# Patient Record
Sex: Male | Born: 1945 | Race: Black or African American | Hispanic: No | Marital: Single | Smoking: Former smoker
Health system: Southern US, Community
[De-identification: ages and names within clinical notes are randomized; demographics above are authoritative.]

## PROBLEM LIST (undated history)

## (undated) DIAGNOSIS — J439 Emphysema, unspecified: Secondary | ICD-10-CM

## (undated) DIAGNOSIS — K219 Gastro-esophageal reflux disease without esophagitis: Secondary | ICD-10-CM

## (undated) DIAGNOSIS — E785 Hyperlipidemia, unspecified: Secondary | ICD-10-CM

## (undated) DIAGNOSIS — J45909 Unspecified asthma, uncomplicated: Secondary | ICD-10-CM

## (undated) HISTORY — DX: Emphysema, unspecified: J43.9

## (undated) HISTORY — DX: Gastro-esophageal reflux disease without esophagitis: K21.9

## (undated) HISTORY — PX: COLONOSCOPY: SHX174

## (undated) HISTORY — DX: Hyperlipidemia, unspecified: E78.5

---

## 2004-06-15 ENCOUNTER — Ambulatory Visit: Payer: Self-pay | Admitting: Psychology

## 2004-07-07 ENCOUNTER — Ambulatory Visit: Payer: Self-pay | Admitting: Psychiatry

## 2004-07-25 ENCOUNTER — Ambulatory Visit: Payer: Self-pay | Admitting: Psychology

## 2004-08-30 ENCOUNTER — Ambulatory Visit: Payer: Self-pay | Admitting: Psychology

## 2011-07-26 ENCOUNTER — Ambulatory Visit (HOSPITAL_COMMUNITY): Payer: Self-pay | Admitting: Psychology

## 2013-05-27 ENCOUNTER — Telehealth: Payer: Self-pay | Admitting: *Deleted

## 2017-08-14 ENCOUNTER — Ambulatory Visit (INDEPENDENT_AMBULATORY_CARE_PROVIDER_SITE_OTHER): Payer: Worker's Compensation | Admitting: Pulmonary Disease

## 2017-08-14 ENCOUNTER — Encounter: Payer: Self-pay | Admitting: Pulmonary Disease

## 2017-08-14 ENCOUNTER — Other Ambulatory Visit (INDEPENDENT_AMBULATORY_CARE_PROVIDER_SITE_OTHER): Payer: Medicare PPO

## 2017-08-14 DIAGNOSIS — J920 Pleural plaque with presence of asbestos: Secondary | ICD-10-CM | POA: Insufficient documentation

## 2017-08-14 DIAGNOSIS — J449 Chronic obstructive pulmonary disease, unspecified: Secondary | ICD-10-CM | POA: Insufficient documentation

## 2017-08-14 DIAGNOSIS — R911 Solitary pulmonary nodule: Secondary | ICD-10-CM

## 2017-08-14 LAB — CBC WITH DIFFERENTIAL/PLATELET
BASOS ABS: 0.1 10*3/uL (ref 0.0–0.1)
Basophils Relative: 1.2 % (ref 0.0–3.0)
EOS ABS: 0.4 10*3/uL (ref 0.0–0.7)
Eosinophils Relative: 4.4 % (ref 0.0–5.0)
HCT: 42 % (ref 39.0–52.0)
Hemoglobin: 13.8 g/dL (ref 13.0–17.0)
LYMPHS ABS: 2.4 10*3/uL (ref 0.7–4.0)
Lymphocytes Relative: 25.7 % (ref 12.0–46.0)
MCHC: 32.8 g/dL (ref 30.0–36.0)
MCV: 83.9 fl (ref 78.0–100.0)
MONO ABS: 0.6 10*3/uL (ref 0.1–1.0)
Monocytes Relative: 6.9 % (ref 3.0–12.0)
NEUTROS ABS: 5.8 10*3/uL (ref 1.4–7.7)
NEUTROS PCT: 61.8 % (ref 43.0–77.0)
PLATELETS: 331 10*3/uL (ref 150.0–400.0)
RBC: 5.01 Mil/uL (ref 4.22–5.81)
RDW: 15.3 % (ref 11.5–15.5)
WBC: 9.3 10*3/uL (ref 4.0–10.5)

## 2017-08-14 LAB — COMPREHENSIVE METABOLIC PANEL
ALK PHOS: 92 U/L (ref 39–117)
ALT: 14 U/L (ref 0–53)
AST: 17 U/L (ref 0–37)
Albumin: 3.7 g/dL (ref 3.5–5.2)
BILIRUBIN TOTAL: 0.6 mg/dL (ref 0.2–1.2)
BUN: 10 mg/dL (ref 6–23)
CO2: 29 meq/L (ref 19–32)
CREATININE: 1.13 mg/dL (ref 0.40–1.50)
Calcium: 8.7 mg/dL (ref 8.4–10.5)
Chloride: 106 mEq/L (ref 96–112)
GFR: 82.71 mL/min (ref >60.00)
GLUCOSE: 91 mg/dL (ref 70–99)
Potassium: 4 mEq/L (ref 3.5–5.1)
Sodium: 141 mEq/L (ref 135–145)
TOTAL PROTEIN: 7 g/dL (ref 6.0–8.3)

## 2017-08-14 NOTE — Assessment & Plan Note (Addendum)
  Hypermetabolic left upper lobe nodule, noted in October 04/2017 new compared to 2012 CT  Blood work today. CT chest with IV contrast will be scheduled-since 3 months have passed since this last PET scan  Based on results we will decide about doing a biopsy.  If lymphadenopathy is significant and we may proceed with EBUS and obtain washings of left upper lobe

## 2017-08-14 NOTE — Progress Notes (Signed)
Subjective:    Patient ID: Wesley Daugherty, male    DOB: 09-14-1945, 72 y.o.   MRN: [id number]  HPI  72 year old ex-smoker presents for evaluation of abnormal imaging studies. 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 Esau Grew in [place] for asbestos exposure.  He also follows up with primary care at [facility] clinic with Dr. Leonie Douglas & I Note that he is maintained on Symbicort and Spiriva.  On further questioning, he does mention that the [place] did tell him that he had COPD but these medications have not really helped him and he stopped taking these.  He is referred by Dr. Caron Presume for oncology from [place].  I have reviewed his consultation and his imaging reports. Apparently CT chest without contrast was performed on 04/26/17 which showed new bilateral lung nodules, left upper lobe was noncalcified and lobulated measuring 1.5 x 1.4 cm and right upper lobe was about 5 mm.  These were both Compared to CT chest from 2012 no hilar or mediastinal lymphadenopathy was noted. Subsequent PET scan from 05/18/2017 showed hypermetabolic left upper lobe nodule SUV 2.6, right hilar node was also hypermetabolic with SUV of 5.2 and left hilar was 3.0 with subcarinal lymph node SUV of 3.0 there was some hypermetabolism noted in the distal esophagus.  He reports mild dyspnea on exertion.  He denies frequent chest colds.  He reports intermittent wheezing.  He uses albuterol but is not compliant with Symbicort or Spiriva. He has occasional cough with clear sputum production He denies any problems swallowing There is no history of redness of his eyes or skin lesions  Past medical history of hypertension, asbestos exposure and COPD as well and severe depression  Past surgical history none.  Family history not significant for lung cancer, no history of early cardiac  disease.  Social history he smoked 1/2 pack/day starting in his 12s after he left the service until he quit in 2005, about 15 pack years.  He worked at [employer] as a Psychologist, occupational until he returned and was in 6. He is divorced and does not have any children    Review of Systems  Positive for acid heartburn, shortness of breath with activity, sore throat, nasal congestion  Constitutional: negative for anorexia, fevers and sweats  Eyes: negative for irritation, redness and visual disturbance  Ears, nose, mouth, throat, and face: negative for earaches, epistaxis, nasal congestion and sore throat  Respiratory: negative for cough, dyspnea on exertion, sputum and wheezing  Cardiovascular: negative for chest pain, dyspnea, lower extremity edema, orthopnea, palpitations and syncope  Gastrointestinal: negative for abdominal pain, constipation, diarrhea, melena, nausea and vomiting  Genitourinary:negative for dysuria, frequency and hematuria  Hematologic/lymphatic: negative for bleeding, easy bruising and lymphadenopathy  Musculoskeletal:negative for arthralgias, muscle weakness and stiff joints  Neurological: negative for coordination problems, gait problems, headaches and weakness  Endocrine: negative for diabetic symptoms including polydipsia, polyuria and weight loss     Objective:   Physical Exam  Gen. Pleasant, obese, in no distress, normal affect ENT - no lesions, no post nasal drip, class 2-3 airway Neck: No JVD, no thyromegaly, no carotid bruits Lungs: no use of accessory muscles, no dullness to percussion, decreased without rales or rhonchi  Cardiovascular: Rhythm regular, heart sounds  normal, no murmurs or gallops, no peripheral edema Abdomen: soft and non-tender, no hepatosplenomegaly, BS normal. Musculoskeletal:  No deformities, no cyanosis or clubbing Neuro:  alert, non focal, no tremors        Assessment & Plan:

## 2017-08-14 NOTE — Assessment & Plan Note (Signed)
Obtain pulmonary function tests from Dr. Valorie RooseveltProctor in [place]

## 2017-08-14 NOTE — Assessment & Plan Note (Signed)
Obtain pulmonary function tests from Dr. Valorie RooseveltProctor in [place] Continue Symbicort and Spiriva which [place] [facility]

## 2017-08-14 NOTE — Patient Instructions (Signed)
Blood work today. CT chest with IV contrast will be scheduled.  Based on results we will decide about doing a biopsy.  Obtain pulmonary function tests from Dr. Valorie RooseveltProctor in [place]

## 2017-08-15 NOTE — Progress Notes (Signed)
LMOMTCB x 1 

## 2017-08-27 ENCOUNTER — Ambulatory Visit (HOSPITAL_COMMUNITY): Payer: Worker's Compensation

## 2017-08-28 ENCOUNTER — Ambulatory Visit: Payer: Self-pay | Admitting: Adult Health

## 2017-09-07 ENCOUNTER — Ambulatory Visit (HOSPITAL_COMMUNITY)
Admission: RE | Admit: 2017-09-07 | Discharge: 2017-09-07 | Disposition: A | Discharge status: Home / Self Care | Payer: Worker's Compensation | Source: Ambulatory Visit | Attending: Pulmonary Disease | Admitting: Pulmonary Disease

## 2017-09-07 DIAGNOSIS — R911 Solitary pulmonary nodule: Secondary | ICD-10-CM

## 2017-09-07 DIAGNOSIS — I2510 Atherosclerotic heart disease of native coronary artery without angina pectoris: Secondary | ICD-10-CM | POA: Insufficient documentation

## 2017-09-07 DIAGNOSIS — R918 Other nonspecific abnormal finding of lung field: Secondary | ICD-10-CM | POA: Insufficient documentation

## 2017-09-07 DIAGNOSIS — I700 Atherosclerosis of aorta: Secondary | ICD-10-CM | POA: Insufficient documentation

## 2017-09-07 DIAGNOSIS — J984 Other disorders of lung: Secondary | ICD-10-CM | POA: Insufficient documentation

## 2017-09-07 MED ORDER — IOPAMIDOL (ISOVUE-300) INJECTION 61%
75.0000 mL | Freq: Once | INTRAVENOUS | Status: AC | PRN
  Administered 2017-09-07: 75 mL via INTRAVENOUS

## 2018-10-29 DIAGNOSIS — R911 Solitary pulmonary nodule: Secondary | ICD-10-CM | POA: Diagnosis not present

## 2018-10-29 DIAGNOSIS — J449 Chronic obstructive pulmonary disease, unspecified: Secondary | ICD-10-CM | POA: Diagnosis not present

## 2018-10-29 DIAGNOSIS — D649 Anemia, unspecified: Secondary | ICD-10-CM | POA: Diagnosis not present

## 2019-04-29 IMAGING — CT CT CHEST W/ CM
2 of 4 series · 15 of 36 positions shown, 18 images · IV contrast (iopamidol)
Comparison: None.

CLINICAL DATA: Solitary pulmonary nodule. No prior exams for
comparison.

EXAM:
CT CHEST WITH CONTRAST
TECHNIQUE: Multidetector CT imaging of the chest was performed during
intravenous contrast administration.
CONTRAST:  75mL O81NSQ-UEE IOPAMIDOL (O81NSQ-UEE) INJECTION 61%

[Series 2: axial st · axial · 0.74mm/px · z∈[+1424,+1690]mm · 12 of 157 slices shown, 15 images]
[im 12/157  mediastinal]
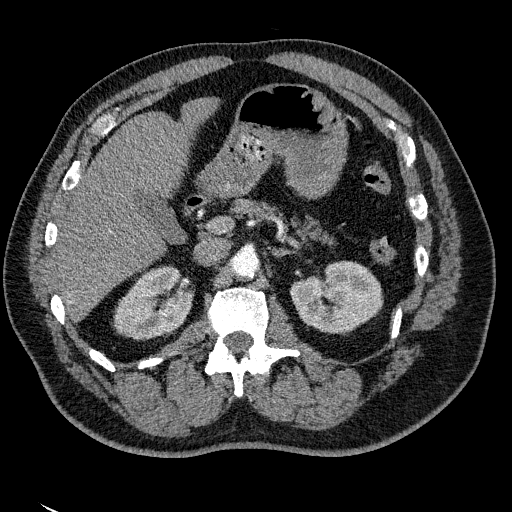
[im 12/157  lung]
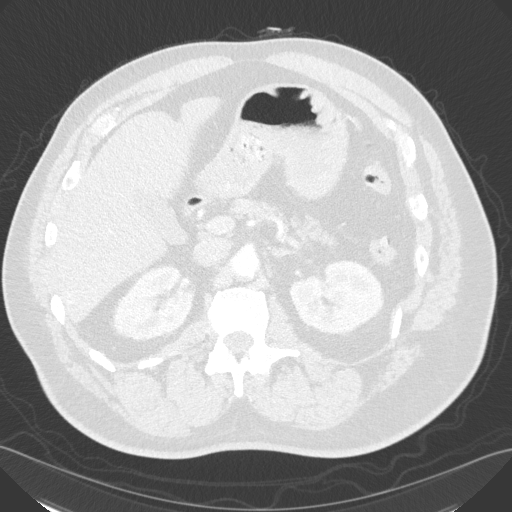
[im 23/157  lung]
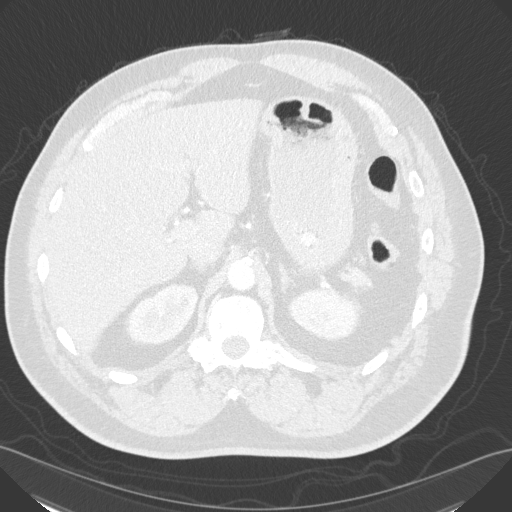
[im 34/157  lung]
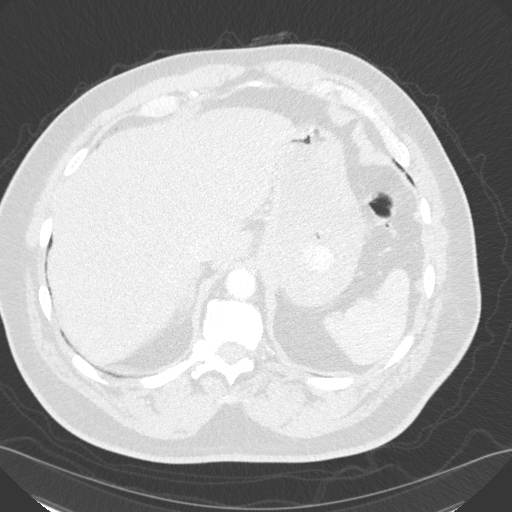
[im 45/157  lung]
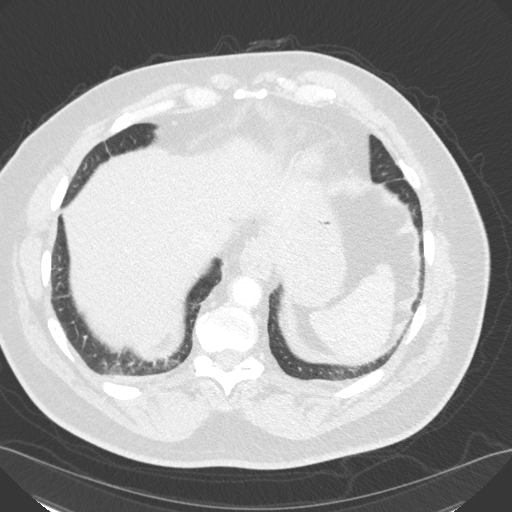
[im 56/157  mediastinal]
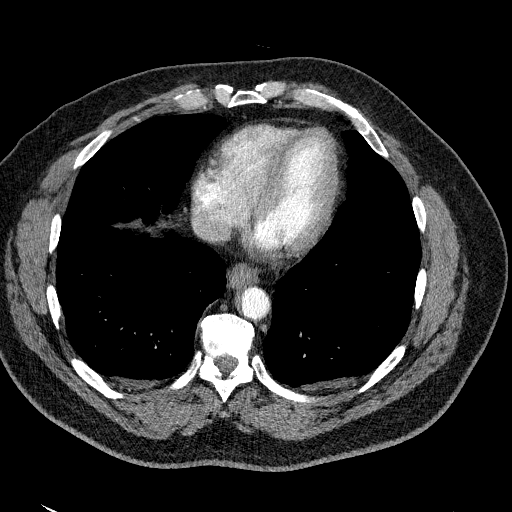
[im 56/157  lung]
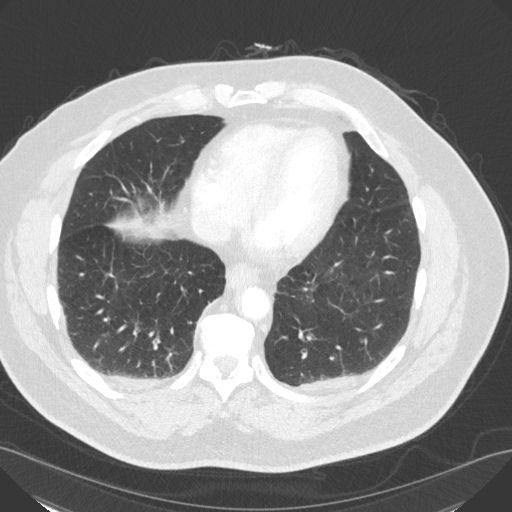
[im 67/157  lung]
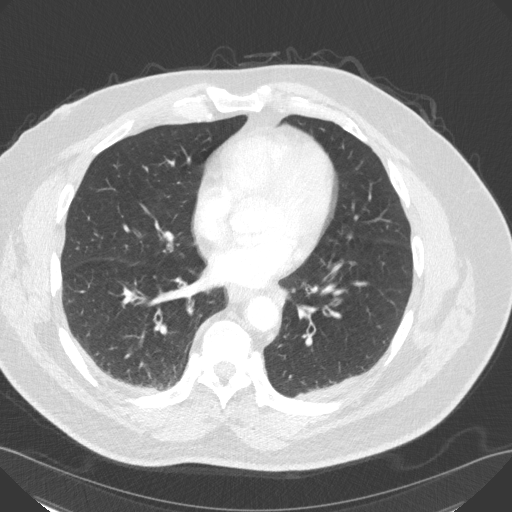
[im 90/157  lung]
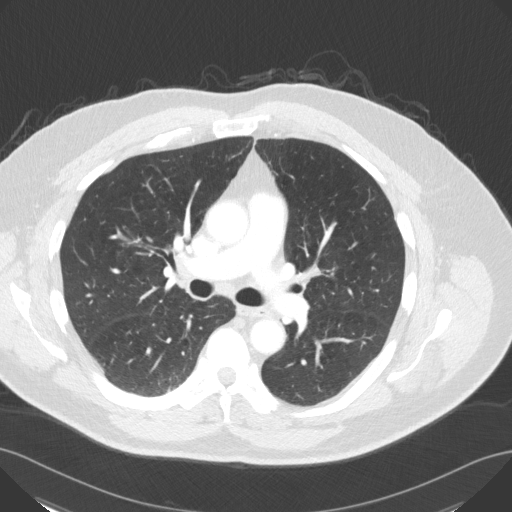
[im 101/157  lung]
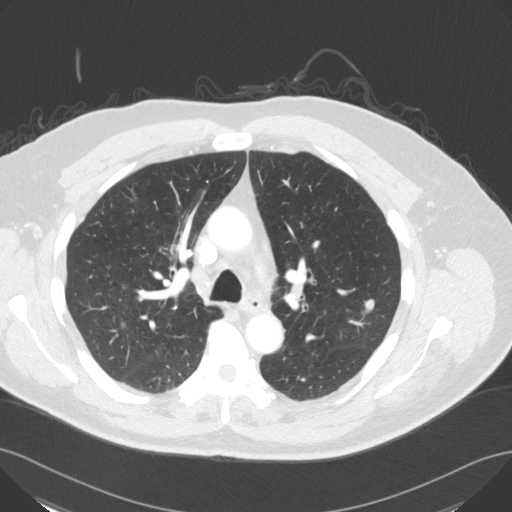
[im 112/157  mediastinal]
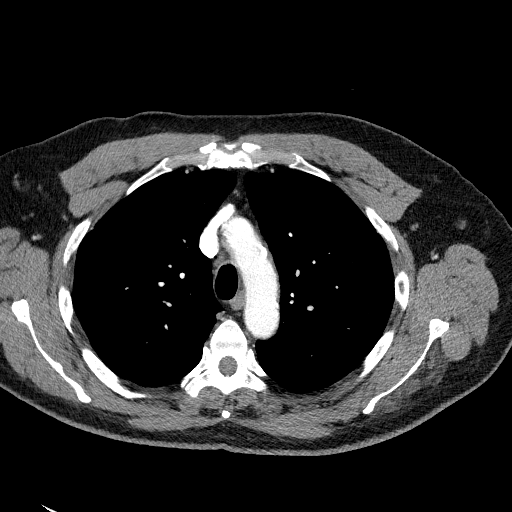
[im 112/157  lung]
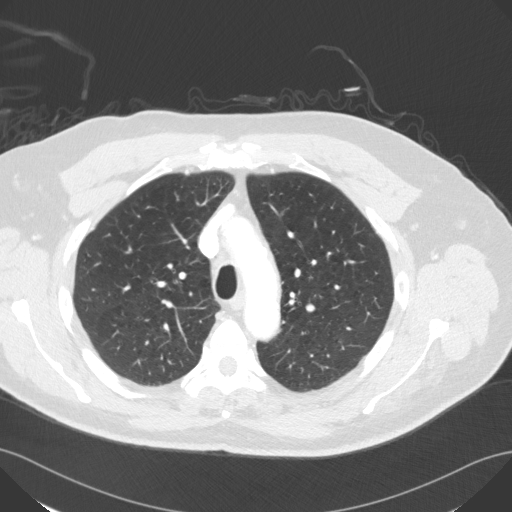
[im 123/157  lung]
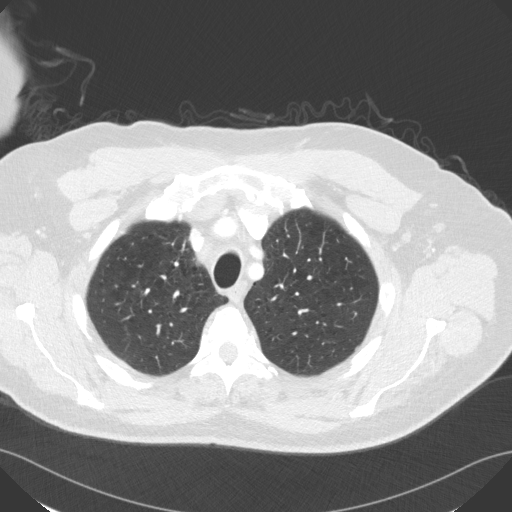
[im 134/157  lung]
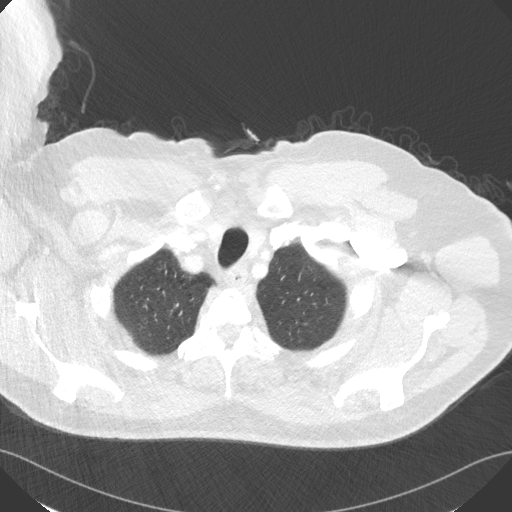
[im 145/157  lung]
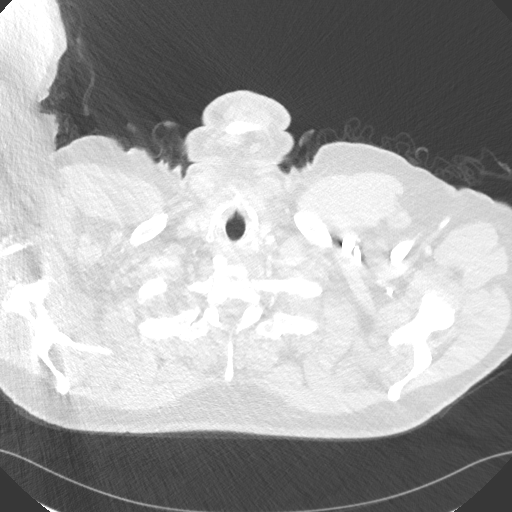

[Series 5: coronal · coronal · 0.60mm/px · 3 of 154 slices shown]
[im 31/154  lung]
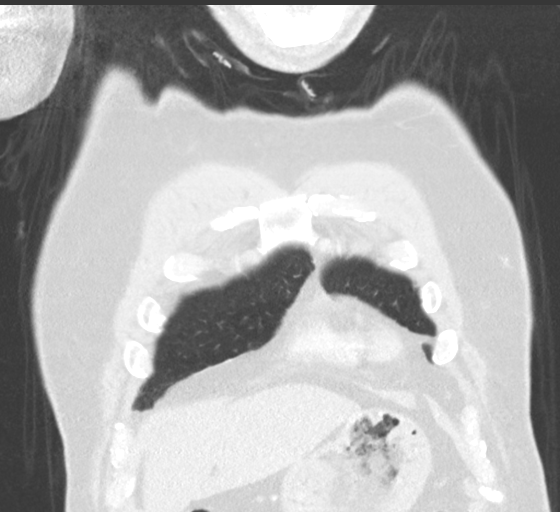
[im 62/154  lung]
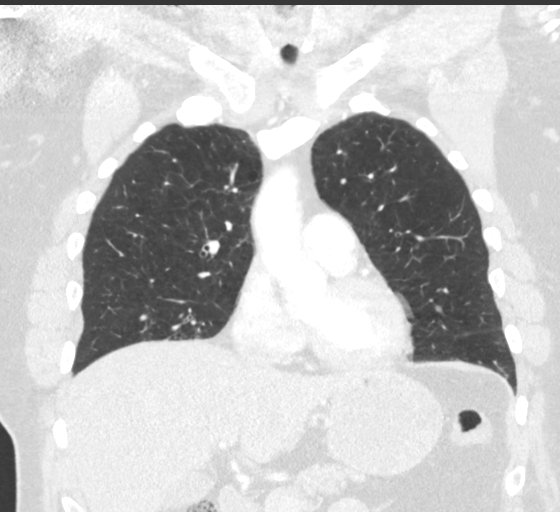
[im 92/154  lung]
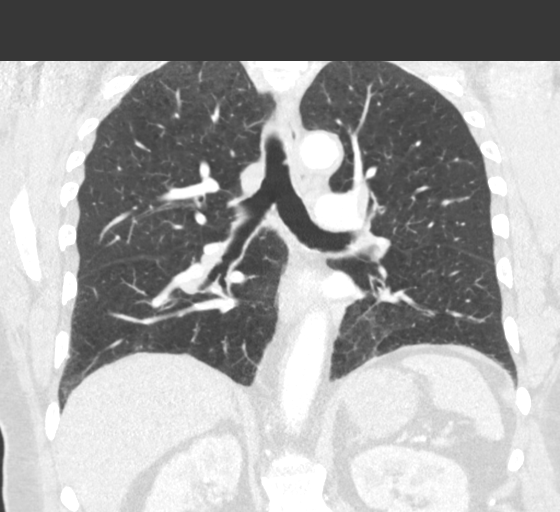

[15 of 36 positions shown; findings below may reference images not displayed]

FINDINGS: Cardiovascular: Heart is normal in size and configuration. Mild left
coronary artery calcifications. Great vessels are normal in caliber.
Mild atherosclerosis along the aortic arch and descending thoracic
aorta. Atherosclerotic plaque is noted at the origin of the branch
vessels, most prominently the left subclavian artery, without
hemodynamically significant stenosis.

Mediastinum/Nodes: No neck base or axillary masses or enlarged lymph
nodes. No mediastinal or hilar masses or pathologically enlarged
lymph nodes. Trachea is widely patent. Esophagus is unremarkable
other than a small sliding hiatal hernia.

Lungs/Pleura: There is a lobulated nodular opacity in the left upper
lobe posteriorly, inferiorly and laterally, centered on image 55,
series 4, measuring approximately 15 x 6 x 14 mm. This lesion is
contiguous with a pulmonary arterial and pulmonary venous branch.
Although it does not enhance, this is consistent with a vascular
malformation, which is likely thrombosed given the lack of
enhancement.

4 mm nodule in the right lower lobe, image 105, series 4. No other
discrete nodules.

There are scattered peripheral reticular opacities consistent with
mild scarring. Minor subsegmental atelectasis is noted in the
posterior lower lobes. No evidence of pneumonia or pulmonary edema.
No pleural effusion or pneumothorax.

Upper Abdomen: No acute findings.

Musculoskeletal: No fracture or acute finding. No osteoblastic or
osteolytic lesions.
IMPRESSION: 1. No acute findings.
2. 15 mm nodule in the left upper lobe connects with a pulmonary
artery and vein consistent with an arteriovenous malformation, most
likely a fistula. However, this does not enhance. Is therefore
presumably thrombosed. Given the communication with the artery and
vein, no further follow-up of this "nodule" is recommended.
3. 4 mm nodule in the right lower lobe. No follow-up needed if
patient is low-risk. Non-contrast chest CT can be considered in 12
months if patient is high-risk. This recommendation follows the
consensus statement: Guidelines for Management of Incidental
Pulmonary Nodules Detected on CT Images: From the [HOSPITAL]
4. Mild areas of lung scarring. Mild left coronary artery
calcifications and mild aortic atherosclerosis.

Aortic Atherosclerosis ([id number].W).

## 2019-08-28 ENCOUNTER — Ambulatory Visit: Payer: Medicare PPO | Attending: Internal Medicine

## 2019-08-28 ENCOUNTER — Other Ambulatory Visit: Payer: Self-pay

## 2019-08-28 DIAGNOSIS — Z20822 Contact with and (suspected) exposure to covid-19: Secondary | ICD-10-CM

## 2019-08-29 LAB — NOVEL CORONAVIRUS, NAA: SARS-CoV-2, NAA: NOT DETECTED

## 2020-06-06 DIAGNOSIS — J9601 Acute respiratory failure with hypoxia: Secondary | ICD-10-CM | POA: Diagnosis not present

## 2020-06-06 DIAGNOSIS — J449 Chronic obstructive pulmonary disease, unspecified: Secondary | ICD-10-CM | POA: Diagnosis not present

## 2020-06-07 DIAGNOSIS — Z299 Encounter for prophylactic measures, unspecified: Secondary | ICD-10-CM | POA: Diagnosis not present

## 2020-06-07 DIAGNOSIS — I10 Essential (primary) hypertension: Secondary | ICD-10-CM | POA: Diagnosis not present

## 2020-06-07 DIAGNOSIS — R609 Edema, unspecified: Secondary | ICD-10-CM | POA: Diagnosis not present

## 2020-06-07 DIAGNOSIS — Z87891 Personal history of nicotine dependence: Secondary | ICD-10-CM | POA: Diagnosis not present

## 2020-06-07 DIAGNOSIS — Z6833 Body mass index (BMI) 33.0-33.9, adult: Secondary | ICD-10-CM | POA: Diagnosis not present

## 2020-06-16 DIAGNOSIS — J9601 Acute respiratory failure with hypoxia: Secondary | ICD-10-CM | POA: Diagnosis not present

## 2020-06-16 DIAGNOSIS — J449 Chronic obstructive pulmonary disease, unspecified: Secondary | ICD-10-CM | POA: Diagnosis not present

## 2020-07-07 DIAGNOSIS — J9601 Acute respiratory failure with hypoxia: Secondary | ICD-10-CM | POA: Diagnosis not present

## 2020-07-07 DIAGNOSIS — J449 Chronic obstructive pulmonary disease, unspecified: Secondary | ICD-10-CM | POA: Diagnosis not present

## 2020-07-08 DIAGNOSIS — Z299 Encounter for prophylactic measures, unspecified: Secondary | ICD-10-CM | POA: Diagnosis not present

## 2020-07-08 DIAGNOSIS — R918 Other nonspecific abnormal finding of lung field: Secondary | ICD-10-CM | POA: Diagnosis not present

## 2020-07-08 DIAGNOSIS — J449 Chronic obstructive pulmonary disease, unspecified: Secondary | ICD-10-CM | POA: Diagnosis not present

## 2020-07-08 DIAGNOSIS — Z87891 Personal history of nicotine dependence: Secondary | ICD-10-CM | POA: Diagnosis not present

## 2020-07-08 DIAGNOSIS — R609 Edema, unspecified: Secondary | ICD-10-CM | POA: Diagnosis not present

## 2020-07-08 DIAGNOSIS — I10 Essential (primary) hypertension: Secondary | ICD-10-CM | POA: Diagnosis not present

## 2020-07-08 DIAGNOSIS — Z6833 Body mass index (BMI) 33.0-33.9, adult: Secondary | ICD-10-CM | POA: Diagnosis not present

## 2020-07-08 DIAGNOSIS — M2578 Osteophyte, vertebrae: Secondary | ICD-10-CM | POA: Diagnosis not present

## 2020-07-08 DIAGNOSIS — R0602 Shortness of breath: Secondary | ICD-10-CM | POA: Diagnosis not present

## 2020-07-08 DIAGNOSIS — I700 Atherosclerosis of aorta: Secondary | ICD-10-CM | POA: Diagnosis not present

## 2020-07-08 DIAGNOSIS — J45909 Unspecified asthma, uncomplicated: Secondary | ICD-10-CM | POA: Diagnosis not present

## 2020-07-17 DIAGNOSIS — J9601 Acute respiratory failure with hypoxia: Secondary | ICD-10-CM | POA: Diagnosis not present

## 2020-07-17 DIAGNOSIS — J449 Chronic obstructive pulmonary disease, unspecified: Secondary | ICD-10-CM | POA: Diagnosis not present

## 2020-07-26 DIAGNOSIS — R600 Localized edema: Secondary | ICD-10-CM | POA: Diagnosis not present

## 2020-08-03 DIAGNOSIS — Z299 Encounter for prophylactic measures, unspecified: Secondary | ICD-10-CM | POA: Diagnosis not present

## 2020-08-03 DIAGNOSIS — Z87891 Personal history of nicotine dependence: Secondary | ICD-10-CM | POA: Diagnosis not present

## 2020-08-03 DIAGNOSIS — J449 Chronic obstructive pulmonary disease, unspecified: Secondary | ICD-10-CM | POA: Diagnosis not present

## 2020-08-03 DIAGNOSIS — I10 Essential (primary) hypertension: Secondary | ICD-10-CM | POA: Diagnosis not present

## 2020-08-03 DIAGNOSIS — Z6833 Body mass index (BMI) 33.0-33.9, adult: Secondary | ICD-10-CM | POA: Diagnosis not present

## 2020-08-03 DIAGNOSIS — J45909 Unspecified asthma, uncomplicated: Secondary | ICD-10-CM | POA: Diagnosis not present

## 2020-08-07 DIAGNOSIS — J9601 Acute respiratory failure with hypoxia: Secondary | ICD-10-CM | POA: Diagnosis not present

## 2020-08-07 DIAGNOSIS — J449 Chronic obstructive pulmonary disease, unspecified: Secondary | ICD-10-CM | POA: Diagnosis not present

## 2020-08-11 DIAGNOSIS — Z87891 Personal history of nicotine dependence: Secondary | ICD-10-CM | POA: Diagnosis not present

## 2020-08-11 DIAGNOSIS — D500 Iron deficiency anemia secondary to blood loss (chronic): Secondary | ICD-10-CM | POA: Diagnosis not present

## 2020-08-11 DIAGNOSIS — R918 Other nonspecific abnormal finding of lung field: Secondary | ICD-10-CM | POA: Diagnosis not present

## 2020-08-11 DIAGNOSIS — J984 Other disorders of lung: Secondary | ICD-10-CM | POA: Diagnosis not present

## 2020-08-12 DIAGNOSIS — D500 Iron deficiency anemia secondary to blood loss (chronic): Secondary | ICD-10-CM | POA: Diagnosis not present

## 2020-08-17 DIAGNOSIS — J9601 Acute respiratory failure with hypoxia: Secondary | ICD-10-CM | POA: Diagnosis not present

## 2020-08-17 DIAGNOSIS — J449 Chronic obstructive pulmonary disease, unspecified: Secondary | ICD-10-CM | POA: Diagnosis not present

## 2020-09-04 DIAGNOSIS — J449 Chronic obstructive pulmonary disease, unspecified: Secondary | ICD-10-CM | POA: Diagnosis not present

## 2020-09-04 DIAGNOSIS — J9601 Acute respiratory failure with hypoxia: Secondary | ICD-10-CM | POA: Diagnosis not present

## 2020-09-10 ENCOUNTER — Encounter (HOSPITAL_COMMUNITY): Payer: Self-pay

## 2020-09-10 ENCOUNTER — Other Ambulatory Visit: Payer: Self-pay

## 2020-09-10 ENCOUNTER — Encounter (HOSPITAL_COMMUNITY)
Admission: RE | Admit: 2020-09-10 | Discharge: 2020-09-10 | Disposition: A | Discharge status: Home / Self Care | Payer: No Typology Code available for payment source | Source: Ambulatory Visit | Attending: Pulmonary Disease | Admitting: Pulmonary Disease

## 2020-09-10 VITALS — BP 120/74 | Ht 69.0 in | Wt 232.4 lb

## 2020-09-10 DIAGNOSIS — J449 Chronic obstructive pulmonary disease, unspecified: Secondary | ICD-10-CM | POA: Insufficient documentation

## 2020-09-10 HISTORY — DX: Unspecified asthma, uncomplicated: J45.909

## 2020-09-10 NOTE — Progress Notes (Signed)
Pulmonary Individual Treatment Plan  Patient Details  Name: Wesley Daugherty MRN: [id number] Date of Birth: 1945-11-06 Referring Provider:   Flowsheet Row PULMONARY REHAB COPD ORIENTATION from 09/10/2020 in [facility] CARDIAC REHABILITATION  Referring Provider Dr. Shelle Iron      Initial Encounter Date:  Flowsheet Row PULMONARY REHAB COPD ORIENTATION from 09/10/2020 in [facility] CARDIAC REHABILITATION  Date 09/10/20      Visit Diagnosis: Chronic obstructive pulmonary disease, unspecified COPD type (HCC)  Patient's Home Medications on Admission:   Current Outpatient Medications:  .  albuterol (PROVENTIL HFA;VENTOLIN HFA) 108 (90 Base) MCG/ACT inhaler, Inhale 2 puffs into the lungs every 6 (six) hours as needed for wheezing or shortness of breath., Disp: , Rfl:  .  amLODipine (NORVASC) 2.5 MG tablet, Take 2.5 mg by mouth daily., Disp: , Rfl:  .  budesonide-formoterol (SYMBICORT) 160-4.5 MCG/ACT inhaler, Inhale 2 puffs into the lungs 2 (two) times daily., Disp: , Rfl:  .  buPROPion (WELLBUTRIN XL) 300 MG 24 hr tablet, Take 300 mg by mouth daily., Disp: , Rfl:  .  hydrochlorothiazide (HYDRODIURIL) 25 MG tablet, Take 25 mg by mouth daily., Disp: , Rfl:  .  metoprolol tartrate (LOPRESSOR) 50 MG tablet, Take 50 mg by mouth 2 (two) times daily. Take 1/2 tab bid, Disp: , Rfl:  .  mirtazapine (REMERON) 15 MG tablet, Take 15 mg by mouth at bedtime., Disp: , Rfl:  .  naproxen (NAPROSYN) 500 MG tablet, Take 500 mg by mouth 2 (two) times daily with a meal., Disp: , Rfl:  .  omeprazole (PRILOSEC) 20 MG capsule, Take 20 mg by mouth daily., Disp: , Rfl:  .  OXcarbazepine (TRILEPTAL) 150 MG tablet, Take 150 mg by mouth 2 (two) times daily., Disp: , Rfl:  .  sildenafil (VIAGRA) 100 MG tablet, Take 100 mg by mouth as needed for erectile dysfunction., Disp: , Rfl:  .  tiotropium (SPIRIVA) 18 MCG inhalation capsule, Place 18 mcg into inhaler and inhale daily., Disp: , Rfl:   Past Medical History: Past  Medical History:  Diagnosis Date  . Asthma     Tobacco Use: Social History   Tobacco Use  Smoking Status Former Smoker  . Packs/day: 0.50  . Years: 15.00  . Pack years: 7.50  . Types: Cigarettes  . Start date: 57  . Quit date: 2  . Years since quitting: 24.2  Smokeless Tobacco Never Used    Labs: Recent Review Flowsheet Data   There is no flowsheet data to display.     Capillary Blood Glucose: No results found for: GLUCAP   Pulmonary Assessment Scores:  Pulmonary Assessment Scores    Row Name 09/10/20 1349         ADL UCSD   SOB Score total 43           CAT Score   CAT Score 23           mMRC Score   mMRC Score 3           UCSD: Self-administered rating of dyspnea associated with activities of daily living (ADLs) 6-point scale (0 = "not at all" to 5 = "maximal or unable to do because of breathlessness")  Scoring Scores range from 0 to 120.  Minimally important difference is 5 units  CAT: CAT can identify the health impairment of COPD patients and is better correlated with disease progression.  CAT has a scoring range of zero to 40. The CAT score is classified into four groups of  low (less than 10), medium (10 - 20), high (21-30) and very high (31-40) based on the impact level of disease on health status. A CAT score over 10 suggests significant symptoms.  A worsening CAT score could be explained by an exacerbation, poor medication adherence, poor inhaler technique, or progression of COPD or comorbid conditions.  CAT MCID is 2 points  mMRC: mMRC (Modified Medical Research Council) Dyspnea Scale is used to assess the degree of baseline functional disability in patients of respiratory disease due to dyspnea. No minimal important difference is established. A decrease in score of 1 point or greater is considered a positive change.   Pulmonary Function Assessment:  Pulmonary Function Assessment - 09/10/20 1342      Breath   Shortness of Breath  Yes;Limiting activity           Exercise Target Goals: Exercise Program Goal: Individual exercise prescription set using results from initial 6 min walk test and THRR while considering  patient's activity barriers and safety.   Exercise Prescription Goal: Initial exercise prescription builds to 30-45 minutes a day of aerobic activity, 2-3 days per week.  Home exercise guidelines will be given to patient during program as part of exercise prescription that the participant will acknowledge.  Activity Barriers & Risk Stratification:  Activity Barriers & Cardiac Risk Stratification - 09/10/20 1348      Activity Barriers & Cardiac Risk Stratification   Activity Barriers Arthritis;Joint Problems;Deconditioning;Shortness of Breath    Cardiac Risk Stratification Low           6 Minute Walk:  6 Minute Walk    Row Name 09/10/20 1511         6 Minute Walk   Phase Initial     Distance 1100 feet     Walk Time 6 minutes     # of Rest Breaks 0     MPH 2.08     METS 1.99     RPE 12     Perceived Dyspnea  13     VO2 Peak 6.98     Symptoms No     Resting HR 65 bpm     Resting BP 120/74     Resting Oxygen Saturation  96 %     Exercise Oxygen Saturation  during 6 min walk 90 %     Max Ex. HR 85 bpm     Max Ex. BP 144/74     2 Minute Post BP 136/74           Interval HR   1 Minute HR 72     2 Minute HR 77     3 Minute HR 84     4 Minute HR 85     5 Minute HR 81     6 Minute HR 83     2 Minute Post HR 65     Interval Heart Rate? Yes           Interval Oxygen   Interval Oxygen? Yes     Baseline Oxygen Saturation % 96 %     1 Minute Oxygen Saturation % 93 %     1 Minute Liters of Oxygen 0 L     2 Minute Oxygen Saturation % 91 %     2 Minute Liters of Oxygen 0 L     3 Minute Oxygen Saturation % 90 %     3 Minute Liters of Oxygen 0 L     4 Minute Oxygen Saturation %  90 %     4 Minute Liters of Oxygen 0 L     5 Minute Oxygen Saturation % 91 %     5 Minute Liters of  Oxygen 0 L     6 Minute Oxygen Saturation % 91 %     6 Minute Liters of Oxygen 0 L     2 Minute Post Oxygen Saturation % 96 %     2 Minute Post Liters of Oxygen 0 L            Oxygen Initial Assessment:  Oxygen Initial Assessment - 09/10/20 1510      Initial 6 min Walk   Oxygen Used None      Program Oxygen Prescription   Program Oxygen Prescription None      Intervention   Short Term Goals To learn and exhibit compliance with exercise, home and travel O2 prescription;To learn and understand importance of monitoring SPO2 with pulse oximeter and demonstrate accurate use of the pulse oximeter.;To learn and understand importance of maintaining oxygen saturations>88%;To learn and demonstrate proper pursed lip breathing techniques or other breathing techniques.;To learn and demonstrate proper use of respiratory medications    Long  Term Goals Exhibits compliance with exercise, home and travel O2 prescription;Verbalizes importance of monitoring SPO2 with pulse oximeter and return demonstration;Maintenance of O2 saturations>88%;Exhibits proper breathing techniques, such as pursed lip breathing or other method taught during program session;Compliance with respiratory medication;Demonstrates proper use of MDI's           Oxygen Re-Evaluation:   Oxygen Discharge (Final Oxygen Re-Evaluation):   Initial Exercise Prescription:  Initial Exercise Prescription - 09/10/20 1500      Date of Initial Exercise RX and Referring Provider   Date 09/10/20    Referring Provider Dr. Shelle Ironlance    Expected Discharge Date 01/13/21      NuStep   Level 1    SPM 60    Minutes 22      Arm Ergometer   Level 1    RPM 45    Minutes 17      Prescription Details   Frequency (times per week) 2    Duration Progress to 30 minutes of continuous aerobic without signs/symptoms of physical distress      Intensity   THRR 40-80% of Max Heartrate 59-118    Ratings of Perceived Exertion 11-13    Perceived  Dyspnea 0-4      Resistance Training   Training Prescription Yes    Weight 3 lbs    Reps 10-15           Perform Capillary Blood Glucose checks as needed.  Exercise Prescription Changes:   Exercise Comments:   Exercise Goals and Review:  Exercise Goals    Row Name 09/10/20 1514             Exercise Goals   Increase Physical Activity Yes       Intervention Provide advice, education, support and counseling about physical activity/exercise needs.;Develop an individualized exercise prescription for aerobic and resistive training based on initial evaluation findings, risk stratification, comorbidities and participant's personal goals.       Expected Outcomes Short Term: Attend rehab on a regular basis to increase amount of physical activity.;Long Term: Add in home exercise to make exercise part of routine and to increase amount of physical activity.;Long Term: Exercising regularly at least 3-5 days a week.       Increase Strength and Stamina Yes  Intervention Provide advice, education, support and counseling about physical activity/exercise needs.;Develop an individualized exercise prescription for aerobic and resistive training based on initial evaluation findings, risk stratification, comorbidities and participant's personal goals.       Expected Outcomes Short Term: Increase workloads from initial exercise prescription for resistance, speed, and METs.;Short Term: Perform resistance training exercises routinely during rehab and add in resistance training at home;Long Term: Improve cardiorespiratory fitness, muscular endurance and strength as measured by increased METs and functional capacity ( )       Able to understand and use rate of perceived exertion (RPE) scale Yes       Intervention Provide education and explanation on how to use RPE scale       Expected Outcomes Short Term: Able to use RPE daily in rehab to express subjective intensity level;Long Term:  Able to use RPE  to guide intensity level when exercising independently       Able to understand and use Dyspnea scale Yes       Intervention Provide education and explanation on how to use Dyspnea scale       Expected Outcomes Short Term: Able to use Dyspnea scale daily in rehab to express subjective sense of shortness of breath during exertion;Long Term: Able to use Dyspnea scale to guide intensity level when exercising independently       Knowledge and understanding of Target Heart Rate Range (THRR) Yes       Intervention Provide education and explanation of THRR including how the numbers were predicted and where they are located for reference       Expected Outcomes Short Term: Able to state/look up THRR;Long Term: Able to use THRR to govern intensity when exercising independently;Short Term: Able to use daily as guideline for intensity in rehab       Understanding of Exercise Prescription Yes       Intervention Provide education, explanation, and written materials on patient's individual exercise prescription       Expected Outcomes Short Term: Able to explain program exercise prescription;Long Term: Able to explain home exercise prescription to exercise independently              Exercise Goals Re-Evaluation :   Discharge Exercise Prescription (Final Exercise Prescription Changes):   Nutrition:  Target Goals: Understanding of nutrition guidelines, daily intake of sodium 1500mg , cholesterol 200mg , calories 30% from fat and 7% or less from saturated fats, daily to have 5 or more servings of fruits and vegetables.  Biometrics:  Pre Biometrics - 09/10/20 1515      Pre Biometrics   Height 5\' 9"  (1.753 m)    Weight 232 lb 5.8 oz (105.4 kg)    Waist Circumference 45.5 inches    Hip Circumference 44.5 inches    Waist to Hip Ratio 1.02 %    BMI (Calculated) 34.3    Triceps Skinfold 14 mm    % Body Fat 32.3 %    Grip Strength 35.3 kg    Flexibility 0 in    Single Leg Stand 18.07 seconds             Nutrition Therapy Plan and Nutrition Goals:   Nutrition Assessments:  Nutrition Assessments - 09/10/20 1353      MEDFICTS Scores   Pre Score 85          MEDIFICTS Score Key:  ?70 Need to make dietary changes   40-70 Heart Healthy Diet  ? 40 Therapeutic Level Cholesterol Diet   Picture Your  Plate Scores:  <16 Unhealthy dietary pattern with much room for improvement.  41-50 Dietary pattern unlikely to meet recommendations for good health and room for improvement.  51-60 More healthful dietary pattern, with some room for improvement.   >60 Healthy dietary pattern, although there may be some specific behaviors that could be improved.    Nutrition Goals Re-Evaluation:   Nutrition Goals Discharge (Final Nutrition Goals Re-Evaluation):   Psychosocial: Target Goals: Acknowledge presence or absence of significant depression and/or stress, maximize coping skills, provide positive support system. Participant is able to verbalize types and ability to use techniques and skills needed for reducing stress and depression.  Initial Review & Psychosocial Screening:  Initial Psych Review & Screening - 09/10/20 1353      Initial Review   Current issues with None Identified      Family Dynamics   Good Support System? Yes    Comments He has a couple of friends who he grew up with and was in the [employer] with that have always been there for him. He has a sister who he speaks with frequently and lives close by. Overall he feels like he has a good support system.      Barriers   Psychosocial barriers to participate in program The patient should benefit from training in stress management and relaxation.      Screening Interventions   Interventions Encouraged to exercise;Provide feedback about the scores to participant    Expected Outcomes Long Term Goal: Stressors or current issues are controlled or eliminated.;Short Term goal: Identification and review with participant of any  Quality of Life or Depression concerns found by scoring the questionnaire.;Long Term goal: The participant improves quality of Life and PHQ9 Scores as seen by post scores and/or verbalization of changes           Quality of Life Scores:  Quality of Life - 09/10/20 1516      Quality of Life   Select Quality of Life      Quality of Life Scores   Health/Function Pre 14.85 %    Socioeconomic Pre 20.14 %    Psych/Spiritual Pre 26.42 %    Family Pre 13.5 %    GLOBAL Pre 18.06 %          Scores of 19 and below usually indicate a poorer quality of life in these areas.  A difference of  2-3 points is a clinically meaningful difference.  A difference of 2-3 points in the total score of the Quality of Life Index has been associated with significant improvement in overall quality of life, self-image, physical symptoms, and general health in studies assessing change in quality of life.   PHQ-9: Recent Review Flowsheet Data    Depression screen [facility] 2/9 09/10/2020   Decreased Interest 1   Down, Depressed, Hopeless 1   PHQ - 2 Score 2   Altered sleeping 1   Tired, decreased energy 1   Change in appetite 2   Feeling bad or failure about yourself  0   Trouble concentrating 1   Moving slowly or fidgety/restless 0   Suicidal thoughts 1    PHQ-9 Score 8   Difficult doing work/chores Somewhat difficult     Interpretation of Total Score  Total Score Depression Severity:  1-4 = Minimal depression, 5-9 = Mild depression, 10-14 = Moderate depression, 15-19 = Moderately severe depression, 20-27 = Severe depression   Psychosocial Evaluation and Intervention:  Psychosocial Evaluation - 09/10/20 1517  Psychosocial Evaluation & Interventions   Interventions Stress management education;Relaxation education;Encouraged to exercise with the program and follow exercise prescription    Comments Patient has no barriers in attending the pulmonary rehab program. He is unsure if he wants to do the full  18 weeks, but states that he will do at least 12. Patient lives alone, but does report that he has a good support system. He has several friends in the area which he has been friends with since he was in high school, and some of them served in [employer] with him. He also has a sister that lives near him who he speaks to regularly. His PHQ-9 was an 9, and his overall QOL was a 18.06. He states that he no longer has any hobbies, as he has gotten older, he feels like he no longer has the ability or energy to engage in his hobbies anymore. His hobbies included working on cars and racing cars. He states that his makes him feel like he does not have a purpose at times, and wonders if it would matter if he was dead. However, he states that he does not have any suicidal thoughts and has no plans of hurting himself. He declined a referral to a counselor and states that he copes well on his own. I think he will benefit from exercise and being around other people. I encouraged him to try to find other hobbies that were not as physically demanding. His overall goals in the program are to lose some weight and to breathe better.    Expected Outcomes The patient's psychosocial issues will decrease as he becomes stronger by attending pulmonary rehab. Through stress management, relaxation management, and exercise, the patient will begin to feel his sense of purpose again, and his QOL will increase.    Continue Psychosocial Services  Follow up required by staff           Psychosocial Re-Evaluation:   Psychosocial Discharge (Final Psychosocial Re-Evaluation):    Education: Education Goals: Education classes will be provided on a weekly basis, covering required topics. Participant will state understanding/return demonstration of topics presented.  Learning Barriers/Preferences:  Learning Barriers/Preferences - 09/10/20 1355      Learning Barriers/Preferences   Learning Barriers None    Learning Preferences Skilled  Demonstration;Pictoral;Written Material           Education Topics: How Lungs Work and Diseases: - Discuss the anatomy of the lungs and diseases that can affect the lungs, such as COPD.   Exercise: -Discuss the importance of exercise, FITT principles of exercise, normal and abnormal responses to exercise, and how to exercise safely.   Environmental Irritants: -Discuss types of environmental irritants and how to limit exposure to environmental irritants.   Meds/Inhalers and oxygen: - Discuss respiratory medications, definition of an inhaler and oxygen, and the proper way to use an inhaler and oxygen.   Energy Saving Techniques: - Discuss methods to conserve energy and decrease shortness of breath when performing activities of daily living.    Bronchial Hygiene / Breathing Techniques: - Discuss breathing mechanics, pursed-lip breathing technique,  proper posture, effective ways to clear airways, and other functional breathing techniques   Cleaning Equipment: - Provides group verbal and written instruction about the health risks of elevated stress, cause of high stress, and healthy ways to reduce stress.   Nutrition I: Fats: - Discuss the types of cholesterol, what cholesterol does to the body, and how cholesterol levels can be controlled.   Nutrition II:  Labels: -Discuss the different components of food labels and how to read food labels.   Respiratory Infections: - Discuss the signs and symptoms of respiratory infections, ways to prevent respiratory infections, and the importance of seeking medical treatment when having a respiratory infection.   Stress I: Signs and Symptoms: - Discuss the causes of stress, how stress may lead to anxiety and depression, and ways to limit stress.   Stress II: Relaxation: -Discuss relaxation techniques to limit stress.   Oxygen for Home/Travel: - Discuss how to prepare for travel when on oxygen and proper ways to transport and  store oxygen to ensure safety.   Knowledge Questionnaire Score:  Knowledge Questionnaire Score - 09/10/20 1405      Knowledge Questionnaire Score   Pre Score 16/18           Core Components/Risk Factors/Patient Goals at Admission:  Personal Goals and Risk Factors at Admission - 09/10/20 1356      Core Components/Risk Factors/Patient Goals on Admission    Weight Management Yes;Weight Loss    Intervention Weight Management: Develop a combined nutrition and exercise program designed to reach desired caloric intake, while maintaining appropriate intake of nutrient and fiber, sodium and fats, and appropriate energy expenditure required for the weight goal.;Weight Management: Provide education and appropriate resources to help participant work on and attain dietary goals.;Weight Management/Obesity: Establish reasonable short term and long term weight goals.;Obesity: Provide education and appropriate resources to help participant work on and attain dietary goals.    Expected Outcomes Short Term: Continue to assess and modify interventions until short term weight is achieved;Long Term: Adherence to nutrition and physical activity/exercise program aimed toward attainment of established weight goal;Weight Loss: Understanding of general recommendations for a balanced deficit meal plan, which promotes 1-2 lb weight loss per week and includes a negative energy balance of [phone] kcal/d;Understanding of distribution of calorie intake throughout the day with the consumption of 4-5 meals/snacks;Understanding recommendations for meals to include 15-35% energy as protein, 25-35% energy from fat, 35-60% energy from carbohydrates, less than  of dietary cholesterol, 20-35 gm of total fiber daily    Improve shortness of breath with ADL's Yes    Intervention Provide education, individualized exercise plan and daily activity instruction to help decrease symptoms of SOB with activities of daily living.     Expected Outcomes Short Term: Improve cardiorespiratory fitness to achieve a reduction of symptoms when performing ADLs;Long Term: Be able to perform more ADLs without symptoms or delay the onset of symptoms           Core Components/Risk Factors/Patient Goals Review:    Core Components/Risk Factors/Patient Goals at Discharge (Final Review):    ITP Comments:   Comments: Patient arrived for 1st visit/orientation/education at 12. Patient was referred to PR by Dr. Shelle Iron at the [facility] due to COPD, unspecified (J44.9). During orientation advised patient on arrival and appointment times what to wear, what to do before, during and after exercise. Reviewed attendance and class policy.  Pt is scheduled to return Pulmonary Rehab on 09/14/2020 at 1500. Pt was advised to come to class 15 minutes before class starts.  Discussed RPE/Dpysnea scales. Patient participated in warm up stretches. Patient was able to complete 6 minute walk test. Patient was measured for the equipment. Discussed equipment safety with patient. Took patient pre-anthropometric measurements. Patient finished visit at 1445.

## 2020-09-14 ENCOUNTER — Other Ambulatory Visit: Payer: Self-pay

## 2020-09-14 ENCOUNTER — Encounter (HOSPITAL_COMMUNITY)
Admission: RE | Admit: 2020-09-14 | Discharge: 2020-09-14 | Disposition: A | Discharge status: Home / Self Care | Payer: No Typology Code available for payment source | Source: Ambulatory Visit | Attending: Pulmonary Disease | Admitting: Pulmonary Disease

## 2020-09-14 DIAGNOSIS — J449 Chronic obstructive pulmonary disease, unspecified: Secondary | ICD-10-CM

## 2020-09-14 DIAGNOSIS — J9601 Acute respiratory failure with hypoxia: Secondary | ICD-10-CM | POA: Diagnosis not present

## 2020-09-14 NOTE — Progress Notes (Signed)
Cardiac/Pulmonary Rehab Medication Review by a Pharmacist  Does the patient  feel that his/her medications are working for him/her?  yes  Has the patient been experiencing any side effects to the medications prescribed?  no  Does the patient measure his/her own blood pressure or blood glucose at home?  yes   Does the patient have any problems obtaining medications due to transportation or finances?   no  Understanding of regimen: good Understanding of indications: good Potential of compliance: good  Questions asked to Determine Patient Understanding of Medication Regimen:  1. What is the name of the medication?  2. What is the medication used for?  3. When should it be taken?  4. How much should be taken?  5. How will you take it?  6. What side effects should you report?  Understanding Defined as: Excellent: All questions above are correct Good: Questions 1-4 are correct Fair: Questions 1-2 are correct  Poor: 1 or none of the above questions are correct   Pharmacist comments: Patient presents for pulmonary rehab. I reviewed medications with patient. He is familiar with his medications but still needs reassurance on some of them and the indication. He gets most of his medications from the [place]. Some medications, symbicort, he gets from a local pharmacy because the [place] started substituting another combination agent which was as effective for patient. He is on a fluid pill now daily and I suggested weighing himself daily as well as, checking his BP daily. He does have a BP cuff but only checks occasionally. I also instructed that his symbicort should be taken twice daily and not as needed. He does have albuterol rescue IH as well as nebs if needed.  He is tolerating his regimen and not having any side effects. Continue current regimen.  Thanks for the opportunity to participate in the care of this patient,  Elder Cyphers, BS Loura Back, [place] Clinical Pharmacist Pager [phone] 09/14/2020  3:11 PM

## 2020-09-14 NOTE — Progress Notes (Signed)
Daily Session Note  Patient Details  Name: Wesley Daugherty MRN: [id number] Date of Birth: 1946-01-02 Referring Provider:   Flowsheet Row PULMONARY REHAB COPD ORIENTATION from 09/10/2020 in [place]  Referring Provider Dr. Gwenette Greet      Encounter Date: 09/14/2020  Check In:  Session Check In - 09/14/20 1500      Check-In   Supervising physician immediately available to respond to emergencies [facility] MD immediately available    Physician(s) Dr. Harrington Challenger    Location [facility]    Staff Present Cathren Harsh, MS, Exercise Physiologist;[place] Kris Mouton, MS, ACSM-CEP, Exercise Physiologist    Virtual Visit No    Medication changes reported     No    Fall or balance concerns reported    No    Tobacco Cessation No Change    Warm-up and Cool-down Not performed (comment)   meeting with pharmacy   Resistance Training Performed No   meeting with pharmacy   VAD Patient? No    PAD/SET Patient? No      Pain Assessment   Currently in Pain? No/denies    Multiple Pain Sites No           Capillary Blood Glucose: No results found for this or any previous visit (from the past 24 hour(s)).    Social History   Tobacco Use  Smoking Status Former Smoker  . Packs/day: 0.50  . Years: 15.00  . Pack years: 7.50  . Types: Cigarettes  . Start date: 5  . Quit date: 38  . Years since quitting: 24.2  Smokeless Tobacco Never Used    Goals Met:  Independence with exercise equipment Exercise tolerated well No report of cardiac concerns or symptoms Strength training completed today  Goals Unmet:  Not Applicable  Comments: check out 1600   Dr. Kathie Dike is Medical Director for [facility].

## 2020-09-16 ENCOUNTER — Encounter (HOSPITAL_COMMUNITY): Payer: No Typology Code available for payment source

## 2020-09-21 ENCOUNTER — Encounter (HOSPITAL_COMMUNITY)
Admission: RE | Admit: 2020-09-21 | Discharge: 2020-09-21 | Disposition: A | Discharge status: Home / Self Care | Payer: No Typology Code available for payment source | Source: Ambulatory Visit | Attending: Pulmonary Disease | Admitting: Pulmonary Disease

## 2020-09-21 ENCOUNTER — Other Ambulatory Visit: Payer: Self-pay

## 2020-09-21 VITALS — Wt 230.4 lb

## 2020-09-21 DIAGNOSIS — J449 Chronic obstructive pulmonary disease, unspecified: Secondary | ICD-10-CM

## 2020-09-21 NOTE — Progress Notes (Signed)
Daily Session Note  Patient Details  Name: Wesley Daugherty MRN: [id number] Date of Birth: 03-19-1946 Referring Provider:   Flowsheet Row PULMONARY REHAB COPD ORIENTATION from 09/10/2020 in [place]  Referring Provider Dr. Gwenette Greet      Encounter Date: 09/21/2020  Check In:  Session Check In - 09/21/20 1500      Check-In   Supervising physician immediately available to respond to emergencies [facility] MD immediately available    Physician(s) Dr. Harl Bowie    Location [facility]    Staff Present Cathren Harsh, MS, Exercise Physiologist;Taylinn Brabant Kris Mouton, MS, ACSM-CEP, Exercise Physiologist    Virtual Visit No    Medication changes reported     No    Fall or balance concerns reported    No    Tobacco Cessation No Change    Warm-up and Cool-down Performed as group-led instruction    Resistance Training Performed Yes    VAD Patient? No    PAD/SET Patient? No      Pain Assessment   Currently in Pain? No/denies    Multiple Pain Sites No           Capillary Blood Glucose: No results found for this or any previous visit (from the past 24 hour(s)).    Social History   Tobacco Use  Smoking Status Former Smoker  . Packs/day: 0.50  . Years: 15.00  . Pack years: 7.50  . Types: Cigarettes  . Start date: 56  . Quit date: 51  . Years since quitting: 24.2  Smokeless Tobacco Never Used    Goals Met:  Independence with exercise equipment Exercise tolerated well No report of cardiac concerns or symptoms Strength training completed today  Goals Unmet:  Not Applicable  Comments: checkout time is 1600   Dr. Kathie Dike is Medical Director for [facility].

## 2020-09-23 ENCOUNTER — Encounter (HOSPITAL_COMMUNITY)
Admission: RE | Admit: 2020-09-23 | Discharge: 2020-09-23 | Disposition: A | Discharge status: Home / Self Care | Payer: No Typology Code available for payment source | Source: Ambulatory Visit | Attending: Pulmonary Disease | Admitting: Pulmonary Disease

## 2020-09-23 ENCOUNTER — Other Ambulatory Visit: Payer: Self-pay

## 2020-09-23 DIAGNOSIS — J449 Chronic obstructive pulmonary disease, unspecified: Secondary | ICD-10-CM | POA: Diagnosis not present

## 2020-09-23 NOTE — Progress Notes (Signed)
Daily Session Note  Patient Details  Name: Wesley Daugherty MRN: [id number] Date of Birth: 28-Feb-1946 Referring Provider:   Flowsheet Row PULMONARY REHAB COPD ORIENTATION from 09/10/2020 in [place]  Referring Provider Dr. Gwenette Greet      Encounter Date: 09/23/2020  Check In:  Session Check In - 09/23/20 1500      Check-In   Supervising physician immediately available to respond to emergencies [facility] immediately available    Physician(s) Dr. Harl Bowie    Location [facility]    Staff Present Aundra Dubin, RN, BSN;[place] Audria Nine, MS, Exercise Physiologist    Virtual Visit No    Medication changes reported     No    Fall or balance concerns reported    No    Tobacco Cessation No Change    Warm-up and Cool-down Performed as group-led instruction    Resistance Training Performed Yes    VAD Patient? No    PAD/SET Patient? No      Pain Assessment   Currently in Pain? Yes    Pain Score 5     Pain Location Shoulder    Pain Orientation Right    Pain Descriptors / Indicators Aching    Pain Type Chronic pain    Pain Onset In the past 7 days    Pain Frequency Intermittent    Multiple Pain Sites No           Capillary Blood Glucose: No results found for this or any previous visit (from the past 24 hour(s)).    Social History   Tobacco Use  Smoking Status Former Smoker  . Packs/day: 0.50  . Years: 15.00  . Pack years: 7.50  . Types: Cigarettes  . Start date: 66  . Quit date: 70  . Years since quitting: 24.2  Smokeless Tobacco Never Used    Goals Met:  Proper associated with RPD/PD & O2 Sat Independence with exercise equipment Improved SOB with ADL's Using PLB without cueing & demonstrates good technique Exercise tolerated well No report of cardiac concerns or symptoms Strength training completed today  Goals Unmet:  Not Applicable  Comments: Check out 1600.   Dr. Kathie Dike is Medical Director for [facility]  Pulmonary Rehab.

## 2020-09-28 ENCOUNTER — Encounter (HOSPITAL_COMMUNITY): Payer: No Typology Code available for payment source

## 2020-09-30 ENCOUNTER — Other Ambulatory Visit: Payer: Self-pay

## 2020-09-30 ENCOUNTER — Encounter (HOSPITAL_COMMUNITY)
Admission: RE | Admit: 2020-09-30 | Discharge: 2020-09-30 | Disposition: A | Discharge status: Home / Self Care | Payer: No Typology Code available for payment source | Source: Ambulatory Visit | Attending: Pulmonary Disease | Admitting: Pulmonary Disease

## 2020-09-30 DIAGNOSIS — J449 Chronic obstructive pulmonary disease, unspecified: Secondary | ICD-10-CM

## 2020-09-30 NOTE — Progress Notes (Signed)
Daily Session Note  Patient Details  Name: Wesley Daugherty MRN: [id number] Date of Birth: [birth year] Referring Provider:   Flowsheet Row PULMONARY REHAB COPD ORIENTATION from 09/10/2020 in [place]  Referring Provider Dr. Gwenette Greet      Encounter Date: 09/30/2020  Check In:  Session Check In - 09/30/20 1500      Check-In   Supervising physician immediately available to respond to emergencies [facility] MD immediately available    Physician(s) Dr. Domenic Polite    Location [facility]    Staff Present Cathren Harsh, MS, Exercise Physiologist    Virtual Visit No    Medication changes reported     No    Fall or balance concerns reported    No    Tobacco Cessation No Change    Warm-up and Cool-down Performed as group-led instruction    Resistance Training Performed Yes    VAD Patient? No    PAD/SET Patient? No      Pain Assessment   Currently in Pain? No/denies    Multiple Pain Sites No           Capillary Blood Glucose: No results found for this or any previous visit (from the past 24 hour(s)).    Social History   Tobacco Use  Smoking Status Former Smoker  . Packs/day: 0.50  . Years: 15.00  . Pack years: 7.50  . Types: Cigarettes  . Start date: 23  . Quit date: 40  . Years since quitting: 24.2  Smokeless Tobacco Never Used    Goals Met:  Independence with exercise equipment Exercise tolerated well No report of cardiac concerns or symptoms Strength training completed today  Goals Unmet:  Not Applicable  Comments: check out 1600   Dr. Kathie Dike is Medical Director for [facility] Re[facility].

## 2020-10-01 DIAGNOSIS — Z6835 Body mass index (BMI) 35.0-35.9, adult: Secondary | ICD-10-CM | POA: Diagnosis not present

## 2020-10-01 DIAGNOSIS — I10 Essential (primary) hypertension: Secondary | ICD-10-CM | POA: Diagnosis not present

## 2020-10-01 DIAGNOSIS — J449 Chronic obstructive pulmonary disease, unspecified: Secondary | ICD-10-CM | POA: Diagnosis not present

## 2020-10-01 DIAGNOSIS — J45909 Unspecified asthma, uncomplicated: Secondary | ICD-10-CM | POA: Diagnosis not present

## 2020-10-01 DIAGNOSIS — Z299 Encounter for prophylactic measures, unspecified: Secondary | ICD-10-CM | POA: Diagnosis not present

## 2020-10-05 ENCOUNTER — Other Ambulatory Visit: Payer: Self-pay

## 2020-10-05 ENCOUNTER — Encounter (HOSPITAL_COMMUNITY)
Admission: RE | Admit: 2020-10-05 | Discharge: 2020-10-05 | Disposition: A | Discharge status: Home / Self Care | Payer: No Typology Code available for payment source | Source: Ambulatory Visit | Attending: Pulmonary Disease | Admitting: Pulmonary Disease

## 2020-10-05 VITALS — Wt 236.3 lb

## 2020-10-05 DIAGNOSIS — J449 Chronic obstructive pulmonary disease, unspecified: Secondary | ICD-10-CM | POA: Diagnosis not present

## 2020-10-05 DIAGNOSIS — J9601 Acute respiratory failure with hypoxia: Secondary | ICD-10-CM | POA: Diagnosis not present

## 2020-10-05 NOTE — Progress Notes (Signed)
Daily Session Note  Patient Details  Name: Wesley Daugherty MRN: [id number] Date of Birth: January 07, 1946 Referring Provider:   Flowsheet Row PULMONARY REHAB COPD ORIENTATION from 09/10/2020 in [place]  Referring Provider Dr. Gwenette Greet      Encounter Date: 10/05/2020  Check In:  Session Check In - 10/05/20 1500      Check-In   Supervising physician immediately available to respond to emergencies [facility] MD immediately available    Physician(s) Dr. Harl Bowie    Location [facility]    Staff Present Cathren Harsh, MS, Exercise Physiologist;Kalev Temme Kris Mouton, MS, ACSM-CEP, Exercise Physiologist    Virtual Visit No    Medication changes reported     No    Fall or balance concerns reported    No    Tobacco Cessation No Change    Warm-up and Cool-down Performed as group-led instruction    Resistance Training Performed Yes    VAD Patient? No    PAD/SET Patient? No      Pain Assessment   Currently in Pain? No/denies    Multiple Pain Sites No           Capillary Blood Glucose: No results found for this or any previous visit (from the past 24 hour(s)).    Social History   Tobacco Use  Smoking Status Former Smoker  . Packs/day: 0.50  . Years: 15.00  . Pack years: 7.50  . Types: Cigarettes  . Start date: 27  . Quit date: 72  . Years since quitting: 24.2  Smokeless Tobacco Never Used    Goals Met:  Independence with exercise equipment Exercise tolerated well No report of cardiac concerns or symptoms Strength training completed today  Goals Unmet:  Not Applicable  Comments: checkout time is 1600   Dr. Kathie Dike is Medical Director for [facility] Pulmonary Rehab.

## 2020-10-06 NOTE — Progress Notes (Signed)
Pulmonary Individual Treatment Plan  Patient Details  Name: Wesley Daugherty MRN: [id number] Date of Birth: 01/16/1946 Referring Provider:   Flowsheet Row PULMONARY REHAB COPD ORIENTATION from 09/10/2020 in [facility] CARDIAC REHABILITATION  Referring Provider Dr. Shelle Iron      Initial Encounter Date:  Flowsheet Row PULMONARY REHAB COPD ORIENTATION from 09/10/2020 in [facility] CARDIAC REHABILITATION  Date 09/10/20      Visit Diagnosis: Chronic obstructive pulmonary disease, unspecified COPD type (HCC)  Patient's Home Medications on Admission:   Current Outpatient Medications:  .  albuterol (PROVENTIL HFA;VENTOLIN HFA) 108 (90 Base) MCG/ACT inhaler, Inhale 2 puffs into the lungs every 6 (six) hours as needed for wheezing or shortness of breath., Disp: , Rfl:  .  albuterol (PROVENTIL) (5 MG/ML) 0.5% nebulizer solution, Take 2.5 mg by nebulization every 6 (six) hours as needed for wheezing or shortness of breath., Disp: , Rfl:  .  amLODipine (NORVASC) 2.5 MG tablet, Take 5 mg by mouth daily., Disp: , Rfl:  .  atorvastatin (LIPITOR) 10 MG tablet, Take 10 mg by mouth daily., Disp: , Rfl:  .  budesonide-formoterol (SYMBICORT) 160-4.5 MCG/ACT inhaler, Inhale 2 puffs into the lungs 2 (two) times daily., Disp: , Rfl:  .  buPROPion (WELLBUTRIN XL) 150 MG 24 hr tablet, Take 150 mg by mouth daily., Disp: , Rfl:  .  Ergocalciferol (VITAMIN D2) 50 MCG (2000 UT) TABS, Take 1 tablet by mouth daily., Disp: , Rfl:  .  furosemide (LASIX) 20 MG tablet, Take 20 mg by mouth daily., Disp: , Rfl:  .  metoprolol tartrate (LOPRESSOR) 50 MG tablet, , Disp: , Rfl:  .  mirtazapine (REMERON) 15 MG tablet, Take 15 mg by mouth at bedtime., Disp: , Rfl:  .  naproxen (NAPROSYN) 500 MG tablet, Take 500 mg by mouth 2 (two) times daily with a meal. (Patient not taking: Reported on 09/14/2020), Disp: , Rfl:  .  omeprazole (PRILOSEC) 20 MG capsule, Take 20 mg by mouth daily., Disp: , Rfl:  .  OXcarbazepine (TRILEPTAL) 150 MG  tablet, Take 150 mg by mouth 2 (two) times daily., Disp: , Rfl:  .  sildenafil (VIAGRA) 100 MG tablet, Take 100 mg by mouth as needed for erectile dysfunction., Disp: , Rfl:  .  tiotropium (SPIRIVA) 18 MCG inhalation capsule, Place 18 mcg into inhaler and inhale daily. (Patient not taking: Reported on 09/14/2020), Disp: , Rfl:   Past Medical History: Past Medical History:  Diagnosis Date  . Asthma     Tobacco Use: Social History   Tobacco Use  Smoking Status Former Smoker  . Packs/day: 0.50  . Years: 15.00  . Pack years: 7.50  . Types: Cigarettes  . Start date: 41  . Quit date: 31  . Years since quitting: 24.2  Smokeless Tobacco Never Used    Labs: Recent Review Flowsheet Data   There is no flowsheet data to display.     Capillary Blood Glucose: No results found for: GLUCAP   Pulmonary Assessment Scores:  Pulmonary Assessment Scores    Row Name 09/10/20 1349         ADL UCSD   SOB Score total 43           CAT Score   CAT Score 23           mMRC Score   mMRC Score 3           UCSD: Self-administered rating of dyspnea associated with activities of daily living (ADLs) 6-point scale (0 = "  not at all" to 5 = "maximal or unable to do because of breathlessness")  Scoring Scores range from 0 to 120.  Minimally important difference is 5 units  CAT: CAT can identify the health impairment of COPD patients and is better correlated with disease progression.  CAT has a scoring range of zero to 40. The CAT score is classified into four groups of low (less than 10), medium (10 - 20), high (21-30) and very high (31-40) based on the impact level of disease on health status. A CAT score over 10 suggests significant symptoms.  A worsening CAT score could be explained by an exacerbation, poor medication adherence, poor inhaler technique, or progression of COPD or comorbid conditions.  CAT MCID is 2 points  mMRC: mMRC (Modified Medical Research Council) Dyspnea Scale is  used to assess the degree of baseline functional disability in patients of respiratory disease due to dyspnea. No minimal important difference is established. A decrease in score of 1 point or greater is considered a positive change.   Pulmonary Function Assessment:  Pulmonary Function Assessment - 09/10/20 1342      Breath   Shortness of Breath Yes;Limiting activity           Exercise Target Goals: Exercise Program Goal: Individual exercise prescription set using results from initial 6 min walk test and THRR while considering  patient's activity barriers and safety.   Exercise Prescription Goal: Initial exercise prescription builds to 30-45 minutes a day of aerobic activity, 2-3 days per week.  Home exercise guidelines will be given to patient during program as part of exercise prescription that the participant will acknowledge.  Activity Barriers & Risk Stratification:  Activity Barriers & Cardiac Risk Stratification - 09/10/20 1348      Activity Barriers & Cardiac Risk Stratification   Activity Barriers Arthritis;Joint Problems;Deconditioning;Shortness of Breath    Cardiac Risk Stratification Low           6 Minute Walk:  6 Minute Walk    Row Name 09/10/20 1511         6 Minute Walk   Phase Initial     Distance 1100 feet     Walk Time 6 minutes     # of Rest Breaks 0     MPH 2.08     METS 1.99     RPE 12     Perceived Dyspnea  13     VO2 Peak 6.98     Symptoms No     Resting HR 65 bpm     Resting BP 120/74     Resting Oxygen Saturation  96 %     Exercise Oxygen Saturation  during 6 min walk 90 %     Max Ex. HR 85 bpm     Max Ex. BP 144/74     2 Minute Post BP 136/74           Interval HR   1 Minute HR 72     2 Minute HR 77     3 Minute HR 84     4 Minute HR 85     5 Minute HR 81     6 Minute HR 83     2 Minute Post HR 65     Interval Heart Rate? Yes           Interval Oxygen   Interval Oxygen? Yes     Baseline Oxygen Saturation % 96 %     1  Minute Oxygen  Saturation % 93 %     1 Minute Liters of Oxygen 0 L     2 Minute Oxygen Saturation % 91 %     2 Minute Liters of Oxygen 0 L     3 Minute Oxygen Saturation % 90 %     3 Minute Liters of Oxygen 0 L     4 Minute Oxygen Saturation % 90 %     4 Minute Liters of Oxygen 0 L     5 Minute Oxygen Saturation % 91 %     5 Minute Liters of Oxygen 0 L     6 Minute Oxygen Saturation % 91 %     6 Minute Liters of Oxygen 0 L     2 Minute Post Oxygen Saturation % 96 %     2 Minute Post Liters of Oxygen 0 L            Oxygen Initial Assessment:  Oxygen Initial Assessment - 09/10/20 1510      Initial 6 min Walk   Oxygen Used None      Program Oxygen Prescription   Program Oxygen Prescription None      Intervention   Short Term Goals To learn and exhibit compliance with exercise, home and travel O2 prescription;To learn and understand importance of monitoring SPO2 with pulse oximeter and demonstrate accurate use of the pulse oximeter.;To learn and understand importance of maintaining oxygen saturations>88%;To learn and demonstrate proper pursed lip breathing techniques or other breathing techniques.;To learn and demonstrate proper use of respiratory medications    Long  Term Goals Exhibits compliance with exercise, home and travel O2 prescription;Verbalizes importance of monitoring SPO2 with pulse oximeter and return demonstration;Maintenance of O2 saturations>88%;Exhibits proper breathing techniques, such as pursed lip breathing or other method taught during program session;Compliance with respiratory medication;Demonstrates proper use of MDI's           Oxygen Re-Evaluation:  Oxygen Re-Evaluation    Row Name 10/05/20 1600             Program Oxygen Prescription   Program Oxygen Prescription None               Home Oxygen   Home Oxygen Device E-Tanks;Home Concentrator       Sleep Oxygen Prescription None       Home Exercise Oxygen Prescription Continuous       Liters  per minute 2       Home Resting Oxygen Prescription Continuous       Liters per minute 2       Compliance with Home Oxygen Use Yes               Goals/Expected Outcomes   Short Term Goals To learn and exhibit compliance with exercise, home and travel O2 prescription;To learn and understand importance of monitoring SPO2 with pulse oximeter and demonstrate accurate use of the pulse oximeter.;To learn and understand importance of maintaining oxygen saturations>88%;To learn and demonstrate proper pursed lip breathing techniques or other breathing techniques.;To learn and demonstrate proper use of respiratory medications       Long  Term Goals Exhibits compliance with exercise, home and travel O2 prescription;Verbalizes importance of monitoring SPO2 with pulse oximeter and return demonstration;Maintenance of O2 saturations>88%;Exhibits proper breathing techniques, such as pursed lip breathing or other method taught during program session;Compliance with respiratory medication;Demonstrates proper use of MDI's              Oxygen Discharge (Final Oxygen Re-Evaluation):  Oxygen Re-Evaluation - 10/05/20 1600      Program Oxygen Prescription   Program Oxygen Prescription None      Home Oxygen   Home Oxygen Device E-Tanks;Home Concentrator    Sleep Oxygen Prescription None    Home Exercise Oxygen Prescription Continuous    Liters per minute 2    Home Resting Oxygen Prescription Continuous    Liters per minute 2    Compliance with Home Oxygen Use Yes      Goals/Expected Outcomes   Short Term Goals To learn and exhibit compliance with exercise, home and travel O2 prescription;To learn and understand importance of monitoring SPO2 with pulse oximeter and demonstrate accurate use of the pulse oximeter.;To learn and understand importance of maintaining oxygen saturations>88%;To learn and demonstrate proper pursed lip breathing techniques or other breathing techniques.;To learn and demonstrate proper  use of respiratory medications    Long  Term Goals Exhibits compliance with exercise, home and travel O2 prescription;Verbalizes importance of monitoring SPO2 with pulse oximeter and return demonstration;Maintenance of O2 saturations>88%;Exhibits proper breathing techniques, such as pursed lip breathing or other method taught during program session;Compliance with respiratory medication;Demonstrates proper use of MDI's           Initial Exercise Prescription:  Initial Exercise Prescription - 09/10/20 1500      Date of Initial Exercise RX and Referring Provider   Date 09/10/20    Referring Provider Dr. Shelle Iron    Expected Discharge Date 01/13/21      NuStep   Level 1    SPM 60    Minutes 22      Arm Ergometer   Level 1    RPM 45    Minutes 17      Prescription Details   Frequency (times per week) 2    Duration Progress to 30 minutes of continuous aerobic without signs/symptoms of physical distress      Intensity   THRR 40-80% of Max Heartrate 59-118    Ratings of Perceived Exertion 11-13    Perceived Dyspnea 0-4      Resistance Training   Training Prescription Yes    Weight 3 lbs    Reps 10-15           Perform Capillary Blood Glucose checks as needed.  Exercise Prescription Changes:   Exercise Prescription Changes    Row Name 09/21/20 0748 10/05/20 1600           Response to Exercise   Blood Pressure (Admit) 126/74 124/66      Blood Pressure (Exercise) 144/82 130/72      Blood Pressure (Exit) 108/74 140/76      Heart Rate (Admit) 73 bpm 86 bpm      Heart Rate (Exercise) 85 bpm 90 bpm      Heart Rate (Exit) 71 bpm 82 bpm      Oxygen Saturation (Admit) 96 % 95 %      Oxygen Saturation (Exercise) 94 % 94 %      Oxygen Saturation (Exit) 96 % 98 %      Rating of Perceived Exertion (Exercise) 12 11      Perceived Dyspnea (Exercise) 12 11      Duration Continue with 30 min of aerobic exercise without signs/symptoms of physical distress. Continue with 30 min  of aerobic exercise without signs/symptoms of physical distress.      Intensity THRR unchanged THRR unchanged             Progression   Progression  Continue to progress workloads to maintain intensity without signs/symptoms of physical distress. Continue to progress workloads to maintain intensity without signs/symptoms of physical distress.             Resistance Training   Training Prescription Yes Yes      Weight 3 lbs 4 lbs      Reps 10-15 10-15      Time 10 Minutes 10 Minutes             NuStep   Level 1 1      SPM 68 61      Minutes 22 22      METs 1.9 1.8             Arm Ergometer   Level 1 1      RPM 44 41      Minutes 17 17      METs 1.5 1.5             Exercise Comments:   Exercise Goals and Review:   Exercise Goals    Row Name 09/10/20 1514 10/05/20 1600           Exercise Goals   Increase Physical Activity Yes Yes      Intervention Provide advice, education, support and counseling about physical activity/exercise needs.;Develop an individualized exercise prescription for aerobic and resistive training based on initial evaluation findings, risk stratification, comorbidities and participant's personal goals. Provide advice, education, support and counseling about physical activity/exercise needs.;Develop an individualized exercise prescription for aerobic and resistive training based on initial evaluation findings, risk stratification, comorbidities and participant's personal goals.      Expected Outcomes Short Term: Attend rehab on a regular basis to increase amount of physical activity.;Long Term: Add in home exercise to make exercise part of routine and to increase amount of physical activity.;Long Term: Exercising regularly at least 3-5 days a week. Short Term: Attend rehab on a regular basis to increase amount of physical activity.;Long Term: Add in home exercise to make exercise part of routine and to increase amount of physical activity.;Long Term:  Exercising regularly at least 3-5 days a week.      Increase Strength and Stamina Yes Yes      Intervention Provide advice, education, support and counseling about physical activity/exercise needs.;Develop an individualized exercise prescription for aerobic and resistive training based on initial evaluation findings, risk stratification, comorbidities and participant's personal goals. Provide advice, education, support and counseling about physical activity/exercise needs.;Develop an individualized exercise prescription for aerobic and resistive training based on initial evaluation findings, risk stratification, comorbidities and participant's personal goals.      Expected Outcomes Short Term: Increase workloads from initial exercise prescription for resistance, speed, and METs.;Short Term: Perform resistance training exercises routinely during rehab and add in resistance training at home;Long Term: Improve cardiorespiratory fitness, muscular endurance and strength as measured by increased METs and functional capacity ( ) Short Term: Increase workloads from initial exercise prescription for resistance, speed, and METs.;Short Term: Perform resistance training exercises routinely during rehab and add in resistance training at home;Long Term: Improve cardiorespiratory fitness, muscular endurance and strength as measured by increased METs and functional capacity ( )      Able to understand and use rate of perceived exertion (RPE) scale Yes Yes      Intervention Provide education and explanation on how to use RPE scale Provide education and explanation on how to use RPE scale      Expected Outcomes Short Term: Able to use RPE  daily in rehab to express subjective intensity level;Long Term:  Able to use RPE to guide intensity level when exercising independently Short Term: Able to use RPE daily in rehab to express subjective intensity level;Long Term:  Able to use RPE to guide intensity level when exercising  independently      Able to understand and use Dyspnea scale Yes Yes      Intervention Provide education and explanation on how to use Dyspnea scale Provide education and explanation on how to use Dyspnea scale      Expected Outcomes Short Term: Able to use Dyspnea scale daily in rehab to express subjective sense of shortness of breath during exertion;Long Term: Able to use Dyspnea scale to guide intensity level when exercising independently Short Term: Able to use Dyspnea scale daily in rehab to express subjective sense of shortness of breath during exertion;Long Term: Able to use Dyspnea scale to guide intensity level when exercising independently      Knowledge and understanding of Target Heart Rate Range (THRR) Yes Yes      Intervention Provide education and explanation of THRR including how the numbers were predicted and where they are located for reference Provide education and explanation of THRR including how the numbers were predicted and where they are located for reference      Expected Outcomes Short Term: Able to state/look up THRR;Long Term: Able to use THRR to govern intensity when exercising independently;Short Term: Able to use daily as guideline for intensity in rehab Short Term: Able to state/look up THRR;Long Term: Able to use THRR to govern intensity when exercising independently;Short Term: Able to use daily as guideline for intensity in rehab      Understanding of Exercise Prescription Yes Yes      Intervention Provide education, explanation, and written materials on patient's individual exercise prescription Provide education, explanation, and written materials on patient's individual exercise prescription      Expected Outcomes Short Term: Able to explain program exercise prescription;Long Term: Able to explain home exercise prescription to exercise independently Short Term: Able to explain program exercise prescription;Long Term: Able to explain home exercise prescription to  exercise independently             Exercise Goals Re-Evaluation :  Exercise Goals Re-Evaluation    Row Name 10/05/20 1600             Exercise Goal Re-Evaluation   Exercise Goals Review Increase Physical Activity;Increase Strength and Stamina;Able to understand and use rate of perceived exertion (RPE) scale;Able to understand and use Dyspnea scale;Knowledge and understanding of Target Heart Rate Range (THRR);Able to check pulse independently;Understanding of Exercise Prescription       Comments Patient has completed 5 exercise sessions. He has tolerated exercise well. He uses his rescue inhaler most days before the second station. He has not increased intensities with aerobic training since he started the program. He still takes some breaks and gets short of breath. He is currently working at 1.8 METs on the NuStep. Will continue to monitor and progress as able.       Expected Outcomes Through exercise at rehab and with a home exercise program, patient will achieve their goals.              Discharge Exercise Prescription (Final Exercise Prescription Changes):  Exercise Prescription Changes - 10/05/20 1600      Response to Exercise   Blood Pressure (Admit) 124/66    Blood Pressure (Exercise) 130/72    Blood Pressure (  Exit) 140/76    Heart Rate (Admit) 86 bpm    Heart Rate (Exercise) 90 bpm    Heart Rate (Exit) 82 bpm    Oxygen Saturation (Admit) 95 %    Oxygen Saturation (Exercise) 94 %    Oxygen Saturation (Exit) 98 %    Rating of Perceived Exertion (Exercise) 11    Perceived Dyspnea (Exercise) 11    Duration Continue with 30 min of aerobic exercise without signs/symptoms of physical distress.    Intensity THRR unchanged      Progression   Progression Continue to progress workloads to maintain intensity without signs/symptoms of physical distress.      Resistance Training   Training Prescription Yes    Weight 4 lbs    Reps 10-15    Time 10 Minutes      NuStep    Level 1    SPM 61    Minutes 22    METs 1.8      Arm Ergometer   Level 1    RPM 41    Minutes 17    METs 1.5           Nutrition:  Target Goals: Understanding of nutrition guidelines, daily intake of sodium 1500mg , cholesterol 200mg , calories 30% from fat and 7% or less from saturated fats, daily to have 5 or more servings of fruits and vegetables.  Biometrics:  Pre Biometrics - 10/05/20 1600      Pre Biometrics   Weight 107.2 kg    BMI (Calculated) 34.88            Nutrition Therapy Plan and Nutrition Goals:  Nutrition Therapy & Goals - 09/29/20 1451      Personal Nutrition Goals   Comments Patient scored 85 on his diet assessment. We provide 2 educational sessions with handouts on heart healthy nutrition and offer RD referral.      Intervention Plan   Intervention Nutrition handout(s) given to patient.           Nutrition Assessments:  Nutrition Assessments - 09/10/20 1353      MEDFICTS Scores   Pre Score 85          MEDIFICTS Score Key:  ?70 Need to make dietary changes   40-70 Heart Healthy Diet  ? 40 Therapeutic Level Cholesterol Diet   Picture Your Plate Scores:  <16 Unhealthy dietary pattern with much room for improvement.  41-50 Dietary pattern unlikely to meet recommendations for good health and room for improvement.  51-60 More healthful dietary pattern, with some room for improvement.   >60 Healthy dietary pattern, although there may be some specific behaviors that could be improved.    Nutrition Goals Re-Evaluation:   Nutrition Goals Discharge (Final Nutrition Goals Re-Evaluation):   Psychosocial: Target Goals: Acknowledge presence or absence of significant depression and/or stress, maximize coping skills, provide positive support system. Participant is able to verbalize types and ability to use techniques and skills needed for reducing stress and depression.  Initial Review & Psychosocial Screening:  Initial Psych Review  & Screening - 09/10/20 1353      Initial Review   Current issues with None Identified      Family Dynamics   Good Support System? Yes    Comments He has a couple of friends who he grew up with and was in the [employer] with that have always been there for him. He has a sister who he speaks with frequently and lives close by. Overall he feels like  he has a good support system.      Barriers   Psychosocial barriers to participate in program The patient should benefit from training in stress management and relaxation.      Screening Interventions   Interventions Encouraged to exercise;Provide feedback about the scores to participant    Expected Outcomes Long Term Goal: Stressors or current issues are controlled or eliminated.;Short Term goal: Identification and review with participant of any Quality of Life or Depression concerns found by scoring the questionnaire.;Long Term goal: The participant improves quality of Life and PHQ9 Scores as seen by post scores and/or verbalization of changes           Quality of Life Scores:  Quality of Life - 09/10/20 1516      Quality of Life   Select Quality of Life      Quality of Life Scores   Health/Function Pre 14.85 %    Socioeconomic Pre 20.14 %    Psych/Spiritual Pre 26.42 %    Family Pre 13.5 %    GLOBAL Pre 18.06 %          Scores of 19 and below usually indicate a poorer quality of life in these areas.  A difference of  2-3 points is a clinically meaningful difference.  A difference of 2-3 points in the total score of the Quality of Life Index has been associated with significant improvement in overall quality of life, self-image, physical symptoms, and general health in studies assessing change in quality of life.   PHQ-9: Recent Review Flowsheet Data    Depression screen [facility] 2/9 09/10/2020   Decreased Interest 1   Down, Depressed, Hopeless 1   PHQ - 2 Score 2   Altered sleeping 1   Tired, decreased energy 1   Change in appetite 2    Feeling bad or failure about yourself  0   Trouble concentrating 1   Moving slowly or fidgety/restless 0   Suicidal thoughts 1    PHQ-9 Score 8   Difficult doing work/chores Somewhat difficult     Interpretation of Total Score  Total Score Depression Severity:  1-4 = Minimal depression, 5-9 = Mild depression, 10-14 = Moderate depression, 15-19 = Moderately severe depression, 20-27 = Severe depression   Psychosocial Evaluation and Intervention:  Psychosocial Evaluation - 09/10/20 1517      Psychosocial Evaluation & Interventions   Interventions Stress management education;Relaxation education;Encouraged to exercise with the program and follow exercise prescription    Comments Patient has no barriers in attending the pulmonary rehab program. He is unsure if he wants to do the full 18 weeks, but states that he will do at least 12. Patient lives alone, but does report that he has a good support system. He has several friends in the area which he has been friends with since he was in high school, and some of them served in [employer] with him. He also has a sister that lives near him who he speaks to regularly. His PHQ-9 was an 9, and his overall QOL was a 18.06. He states that he no longer has any hobbies, as he has gotten older, he feels like he no longer has the ability or energy to engage in his hobbies anymore. His hobbies included working on cars and racing cars. He states that his makes him feel like he does not have a purpose at times, and wonders if it would matter if he was dead. However, he states that he does not  have any suicidal thoughts and has no plans of hurting himself. He declined a referral to a counselor and states that he copes well on his own. I think he will benefit from exercise and being around other people. I encouraged him to try to find other hobbies that were not as physically demanding. His overall goals in the program are to lose some weight and to breathe better.     Expected Outcomes The patient's psychosocial issues will decrease as he becomes stronger by attending pulmonary rehab. Through stress management, relaxation management, and exercise, the patient will begin to feel his sense of purpose again, and his QOL will increase.    Continue Psychosocial Services  Follow up required by staff           Psychosocial Re-Evaluation:  Psychosocial Re-Evaluation    Row Name 09/29/20 1453             Psychosocial Re-Evaluation   Current issues with None Identified       Comments Patient is new to the program completing 3 sessions. He stated during his orientation he had feelings of having no purpose at times duet to not being able to do the things he used to do. He does not have a diagnosis of depression or history of depression but is taking Mirtazepine and wellbutrin. Will continue to monitor.       Expected Outcomes Patient will continue to have no psychosocial issues identififed.       Interventions Stress management education;Encouraged to attend Pulmonary Rehabilitation for the exercise;Relaxation education       Continue Psychosocial Services  No Follow up required              Psychosocial Discharge (Final Psychosocial Re-Evaluation):  Psychosocial Re-Evaluation - 09/29/20 1453      Psychosocial Re-Evaluation   Current issues with None Identified    Comments Patient is new to the program completing 3 sessions. He stated during his orientation he had feelings of having no purpose at times duet to not being able to do the things he used to do. He does not have a diagnosis of depression or history of depression but is taking Mirtazepine and wellbutrin. Will continue to monitor.    Expected Outcomes Patient will continue to have no psychosocial issues identififed.    Interventions Stress management education;Encouraged to attend Pulmonary Rehabilitation for the exercise;Relaxation education    Continue Psychosocial Services  No Follow up required             Education: Education Goals: Education classes will be provided on a weekly basis, covering required topics. Participant will state understanding/return demonstration of topics presented.  Learning Barriers/Preferences:  Learning Barriers/Preferences - 09/10/20 1355      Learning Barriers/Preferences   Learning Barriers None    Learning Preferences Skilled Demonstration;Pictoral;Written Material           Education Topics: How Lungs Work and Diseases: - Discuss the anatomy of the lungs and diseases that can affect the lungs, such as COPD.   Exercise: -Discuss the importance of exercise, FITT principles of exercise, normal and abnormal responses to exercise, and how to exercise safely.   Environmental Irritants: -Discuss types of environmental irritants and how to limit exposure to environmental irritants.   Meds/Inhalers and oxygen: - Discuss respiratory medications, definition of an inhaler and oxygen, and the proper way to use an inhaler and oxygen.   Energy Saving Techniques: - Discuss methods to conserve energy and decrease shortness of breath  when performing activities of daily living.    Bronchial Hygiene / Breathing Techniques: - Discuss breathing mechanics, pursed-lip breathing technique,  proper posture, effective ways to clear airways, and other functional breathing techniques   Cleaning Equipment: - Provides group verbal and written instruction about the health risks of elevated stress, cause of high stress, and healthy ways to reduce stress.   Nutrition I: Fats: - Discuss the types of cholesterol, what cholesterol does to the body, and how cholesterol levels can be controlled.   Nutrition II: Labels: -Discuss the different components of food labels and how to read food labels.   Respiratory Infections: - Discuss the signs and symptoms of respiratory infections, ways to prevent respiratory infections, and the importance of seeking medical  treatment when having a respiratory infection.   Stress I: Signs and Symptoms: - Discuss the causes of stress, how stress may lead to anxiety and depression, and ways to limit stress.   Stress II: Relaxation: -Discuss relaxation techniques to limit stress.   Oxygen for Home/Travel: - Discuss how to prepare for travel when on oxygen and proper ways to transport and store oxygen to ensure safety. Flowsheet Row PULMONARY REHAB CHRONIC OBSTRUCTIVE PULMONARY DISEASE [facility]  Date 09/23/20  Educator DJ  Instruction Review Code 1- Verbalizes Understanding      Knowledge Questionnaire Score:  Knowledge Questionnaire Score - 09/10/20 1405      Knowledge Questionnaire Score   Pre Score 16/18           Core Components/Risk Factors/Patient Goals at Admission:  Personal Goals and Risk Factors at Admission - 09/10/20 1356      Core Components/Risk Factors/Patient Goals on Admission    Weight Management Yes;Weight Loss    Intervention Weight Management: Develop a combined nutrition and exercise program designed to reach desired caloric intake, while maintaining appropriate intake of nutrient and fiber, sodium and fats, and appropriate energy expenditure required for the weight goal.;Weight Management: Provide education and appropriate resources to help participant work on and attain dietary goals.;Weight Management/Obesity: Establish reasonable short term and long term weight goals.;Obesity: Provide education and appropriate resources to help participant work on and attain dietary goals.    Expected Outcomes Short Term: Continue to assess and modify interventions until short term weight is achieved;Long Term: Adherence to nutrition and physical activity/exercise program aimed toward attainment of established weight goal;Weight Loss: Understanding of general recommendations for a balanced deficit meal plan, which promotes 1-2 lb weight loss per week and  includes a negative energy balance of [phone] kcal/d;Understanding of distribution of calorie intake throughout the day with the consumption of 4-5 meals/snacks;Understanding recommendations for meals to include 15-35% energy as protein, 25-35% energy from fat, 35-60% energy from carbohydrates, less than 200mg  of dietary cholesterol, 20-35 gm of total fiber daily    Improve shortness of breath with ADL's Yes    Intervention Provide education, individualized exercise plan and daily activity instruction to help decrease symptoms of SOB with activities of daily living.    Expected Outcomes Short Term: Improve cardiorespiratory fitness to achieve a reduction of symptoms when performing ADLs;Long Term: Be able to perform more ADLs without symptoms or delay the onset of symptoms           Core Components/Risk Factors/Patient Goals Review:   Goals and Risk Factor Review    Row Name 09/29/20 1500             Core Components/Risk Factors/Patient Goals Review   Personal  Goals Review Improve shortness of breath with ADL's;Other       Review Patient is new to the program completing 3 sessions. [facility]. His personal goals for the program are to get stronger and improve his breathing. We will continue to monitor his progress as he works toward meeting these goals.       Expected Outcomes Patient will complete the program meeting both program and personal goals.              Core Components/Risk Factors/Patient Goals at Discharge (Final Review):   Goals and Risk Factor Review - 09/29/20 1500      Core Components/Risk Factors/Patient Goals Review   Personal Goals Review Improve shortness of breath with ADL's;Other    Review Patient is new to the program completing 3 sessions. [facility]. His personal goals for the program are to get stronger and improve his breathing. We will continue to monitor his progress as he works toward meeting these goals.     Expected Outcomes Patient will complete the program meeting both program and personal goals.           ITP Comments:   Comments: ITP REVIEW Pt is making expected progress toward pulmonary rehab goals after completing 6 sessions. Recommend continued exercise, life style modification, education, and utilization of breathing techniques to increase stamina and strength and decrease shortness of breath with exertion.

## 2020-10-07 ENCOUNTER — Other Ambulatory Visit: Payer: Self-pay

## 2020-10-07 ENCOUNTER — Encounter (HOSPITAL_COMMUNITY)
Admission: RE | Admit: 2020-10-07 | Discharge: 2020-10-07 | Disposition: A | Discharge status: Home / Self Care | Payer: No Typology Code available for payment source | Source: Ambulatory Visit | Attending: Pulmonary Disease | Admitting: Pulmonary Disease

## 2020-10-07 DIAGNOSIS — J449 Chronic obstructive pulmonary disease, unspecified: Secondary | ICD-10-CM

## 2020-10-07 NOTE — Progress Notes (Signed)
I have reviewed a Home Exercise Prescription with Wesley Daugherty . Wesley Daugherty is not currently exercising at home.  The patient was advised to walk 2 days a week for 15-20 minutes at home.  Wesley Daugherty and I discussed how to progress their exercise prescription.  The patient stated that their goals were to decrease shortness of breath.  The patient stated that they understand the exercise prescription.  We reviewed exercise guidelines, target heart rate during exercise, RPE Scale, weather conditions, NTG use, endpoints for exercise, warmup and cool down.  Patient is encouraged to come to me with any questions. I will continue to follow up with the patient to assist them with progression and safety.

## 2020-10-07 NOTE — Progress Notes (Signed)
Daily Session Note  Patient Details  Name: Wesley Daugherty MRN: [id number] Date of Birth: February 03, 1946 Referring Provider:   Flowsheet Row PULMONARY REHAB COPD ORIENTATION from 09/10/2020 in [place]  Referring Provider Dr. Gwenette Greet      Encounter Date: 10/07/2020  Check In:  Session Check In - 10/07/20 1457      Check-In   Supervising physician immediately available to respond to emergencies [facility] MD immediately available    Physician(s) Dr. Harl Bowie    Location [facility]    Staff Present Aundra Dubin, RN, BSN;Phyllis Billingsley, RN;Madison Audria Nine, MS, Exercise Physiologist    Virtual Visit No    Medication changes reported     No    Fall or balance concerns reported    No    Tobacco Cessation No Change    Warm-up and Cool-down Performed as group-led instruction    Resistance Training Performed Yes    VAD Patient? No    PAD/SET Patient? No      Pain Assessment   Currently in Pain? No/denies    Pain Score 0-No pain    Multiple Pain Sites No           Capillary Blood Glucose: No results found for this or any previous visit (from the past 24 hour(s)).    Social History   Tobacco Use  Smoking Status Former Smoker  . Packs/day: 0.50  . Years: 15.00  . Pack years: 7.50  . Types: Cigarettes  . Start date: 64  . Quit date: 43  . Years since quitting: 24.2  Smokeless Tobacco Never Used    Goals Met:  Proper associated with RPD/PD & O2 Sat Independence with exercise equipment Improved SOB with ADL's Using PLB without cueing & demonstrates good technique Exercise tolerated well No report of cardiac concerns or symptoms Strength training completed today  Goals Unmet:  Not Applicable  Comments: Check out 1600.   Dr. Kathie Dike is Medical Director for [facility].

## 2020-10-12 ENCOUNTER — Other Ambulatory Visit: Payer: Self-pay

## 2020-10-12 ENCOUNTER — Encounter (HOSPITAL_COMMUNITY)
Admission: RE | Admit: 2020-10-12 | Discharge: 2020-10-12 | Disposition: A | Discharge status: Home / Self Care | Payer: No Typology Code available for payment source | Source: Ambulatory Visit | Attending: Pulmonary Disease | Admitting: Pulmonary Disease

## 2020-10-12 VITALS — Wt 240.3 lb

## 2020-10-12 DIAGNOSIS — J449 Chronic obstructive pulmonary disease, unspecified: Secondary | ICD-10-CM | POA: Diagnosis not present

## 2020-10-12 NOTE — Progress Notes (Signed)
Daily Session Note  Patient Details  Name: Wesley Daugherty MRN: [id number] Date of Birth: Dec 17, 1945 Referring Provider:   Flowsheet Row PULMONARY REHAB COPD ORIENTATION from 09/10/2020 in [place]  Referring Provider Dr. Gwenette Greet      Encounter Date: 10/12/2020  Check In:  Session Check In - 10/12/20 1500      Check-In   Supervising physician immediately available to respond to emergencies [facility] MD immediately available    Physician(s) Dr. Harl Bowie    [facility]    Staff Present Cathren Harsh, MS, Exercise Physiologist;Other    Virtual Visit No    Medication changes reported     No    Fall or balance concerns reported    No    Tobacco Cessation No Change    Warm-up and Cool-down Performed as group-led instruction    Resistance Training Performed Yes    VAD Patient? No    PAD/SET Patient? No      Pain Assessment   Currently in Pain? No/denies    Pain Score 0-No pain    Multiple Pain Sites No           Capillary Blood Glucose: No results found for this or any previous visit (from the past 24 hour(s)).    Social History   Tobacco Use  Smoking Status Former Smoker  . Packs/day: 0.50  . Years: 15.00  . Pack years: 7.50  . Types: Cigarettes  . Start date: 45  . Quit date: 34  . Years since quitting: 24.2  Smokeless Tobacco Never Used    Goals Met:  Independence with exercise equipment Exercise tolerated well No report of cardiac concerns or symptoms Strength training completed today  Goals Unmet:  Not Applicable  Comments: check out 1600   Dr. Kathie Dike is Medical Director for [facility].

## 2020-10-14 ENCOUNTER — Encounter (HOSPITAL_COMMUNITY): Payer: No Typology Code available for payment source

## 2020-10-15 DIAGNOSIS — J449 Chronic obstructive pulmonary disease, unspecified: Secondary | ICD-10-CM | POA: Diagnosis not present

## 2020-10-15 DIAGNOSIS — J9601 Acute respiratory failure with hypoxia: Secondary | ICD-10-CM | POA: Diagnosis not present

## 2020-10-19 ENCOUNTER — Encounter (HOSPITAL_COMMUNITY): Payer: No Typology Code available for payment source

## 2020-10-21 ENCOUNTER — Other Ambulatory Visit: Payer: Self-pay

## 2020-10-21 ENCOUNTER — Encounter (HOSPITAL_COMMUNITY)
Admission: RE | Admit: 2020-10-21 | Discharge: 2020-10-21 | Disposition: A | Discharge status: Home / Self Care | Payer: No Typology Code available for payment source | Source: Ambulatory Visit | Attending: Pulmonary Disease | Admitting: Pulmonary Disease

## 2020-10-21 DIAGNOSIS — J449 Chronic obstructive pulmonary disease, unspecified: Secondary | ICD-10-CM

## 2020-10-21 NOTE — Progress Notes (Signed)
Daily Session Note  Patient Details  Name: Wesley Daugherty MRN: [id number] Date of Birth: 1945-08-18 Referring Provider:   Flowsheet Row PULMONARY REHAB COPD ORIENTATION from 09/10/2020 in [place]  Referring Provider Dr. Gwenette Greet      Encounter Date: 10/21/2020  Check In:  Session Check In - 10/21/20 1500      Check-In   Supervising physician immediately available to respond to emergencies [facility] MD immediately available    Physician(s) Dr. Harl Bowie    [facility]    Staff Present Aundra Dubin, RN, BSN;Madison Audria Nine, MS, Exercise Physiologist;Phyllis Billingsley, RN    Virtual Visit No    Medication changes reported     No    Fall or balance concerns reported    No    Tobacco Cessation No Change    Warm-up and Cool-down Performed as group-led instruction    Resistance Training Performed Yes    VAD Patient? No    PAD/SET Patient? No      Pain Assessment   Currently in Pain? No/denies    Pain Score 0-No pain    Multiple Pain Sites No           Capillary Blood Glucose: No results found for this or any previous visit (from the past 24 hour(s)).    Social History   Tobacco Use  Smoking Status Former Smoker  . Packs/day: 0.50  . Years: 15.00  . Pack years: 7.50  . Types: Cigarettes  . Start date: 82  . Quit date: 36  . Years since quitting: 24.3  Smokeless Tobacco Never Used    Goals Met:  Proper associated with RPD/PD & O2 Sat Independence with exercise equipment Improved SOB with ADL's Using PLB without cueing & demonstrates good technique Exercise tolerated well Strength training completed today  Goals Unmet:  Not Applicable  Comments: Check out 1600.   Dr. Kathie Dike is Medical Director for [facility].

## 2020-10-26 ENCOUNTER — Encounter (HOSPITAL_COMMUNITY): Payer: No Typology Code available for payment source

## 2020-10-28 ENCOUNTER — Encounter (HOSPITAL_COMMUNITY): Payer: No Typology Code available for payment source

## 2020-11-02 ENCOUNTER — Other Ambulatory Visit: Payer: Self-pay

## 2020-11-02 ENCOUNTER — Encounter (HOSPITAL_COMMUNITY)
Admission: RE | Admit: 2020-11-02 | Discharge: 2020-11-02 | Disposition: A | Discharge status: Home / Self Care | Payer: No Typology Code available for payment source | Source: Ambulatory Visit | Attending: Pulmonary Disease | Admitting: Pulmonary Disease

## 2020-11-02 VITALS — Wt 239.6 lb

## 2020-11-02 DIAGNOSIS — J449 Chronic obstructive pulmonary disease, unspecified: Secondary | ICD-10-CM | POA: Insufficient documentation

## 2020-11-02 NOTE — Progress Notes (Signed)
Daily Session Note  Patient Details  Name: Wesley Daugherty MRN: [id number] Date of Birth: 09/04/1945 Referring Provider:   Flowsheet Row PULMONARY REHAB COPD ORIENTATION from 09/10/2020 in [place]  Referring Provider Dr. Gwenette Greet      Encounter Date: 11/02/2020  Check In:  Session Check In - 11/02/20 1500      Check-In   Supervising physician immediately available to respond to emergencies [facility] MD immediately available    Physician(s) Dr. Harl Bowie    Location [facility]    Staff Present Cathren Harsh, MS, Exercise Physiologist;Tyeesha Riker Kris Mouton, MS, ACSM-CEP, Exercise Physiologist    Virtual Visit No    Medication changes reported     No    Fall or balance concerns reported    No    Tobacco Cessation No Change    Warm-up and Cool-down Performed as group-led instruction    Resistance Training Performed Yes    VAD Patient? No    PAD/SET Patient? No      Pain Assessment   Currently in Pain? No/denies    Pain Score 0-No pain    Multiple Pain Sites No           Capillary Blood Glucose: No results found for this or any previous visit (from the past 24 hour(s)).    Social History   Tobacco Use  Smoking Status Former Smoker  . Packs/day: 0.50  . Years: 15.00  . Pack years: 7.50  . Types: Cigarettes  . Start date: 44  . Quit date: 14  . Years since quitting: 24.3  Smokeless Tobacco Never Used    Goals Met:  Independence with exercise equipment Exercise tolerated well No report of cardiac concerns or symptoms Strength training completed today  Goals Unmet:  Not Applicable  Comments: checkout time is 1600   Dr. Kathie Dike is Medical Director for [facility].

## 2020-11-04 ENCOUNTER — Encounter (HOSPITAL_COMMUNITY): Payer: No Typology Code available for payment source

## 2020-11-04 DIAGNOSIS — J9601 Acute respiratory failure with hypoxia: Secondary | ICD-10-CM | POA: Diagnosis not present

## 2020-11-04 DIAGNOSIS — J449 Chronic obstructive pulmonary disease, unspecified: Secondary | ICD-10-CM | POA: Diagnosis not present

## 2020-11-04 NOTE — Progress Notes (Signed)
Pulmonary Individual Treatment Plan  Patient Details  Name: Wesley Daugherty MRN: [id number] Date of Birth: 24-Dec-1945 Referring Provider:   Flowsheet Row PULMONARY REHAB COPD ORIENTATION from 09/10/2020 in [facility]  Referring Provider Dr. Gwenette Greet      Initial Encounter Date:  Flowsheet Row PULMONARY REHAB COPD ORIENTATION from 09/10/2020 in [facility]  Date 09/10/20      Visit Diagnosis: Chronic obstructive pulmonary disease, unspecified COPD type ([place])  Patient's Home Medications on Admission:   Current Outpatient Medications:  .  albuterol (PROVENTIL HFA;VENTOLIN HFA) 108 (90 Base) MCG/ACT inhaler, Inhale 2 puffs into the lungs every 6 (six) hours as needed for wheezing or shortness of breath., Disp: , Rfl:  .  albuterol (PROVENTIL) (5 MG/ML) 0.5% nebulizer solution, Take 2.5 mg by nebulization every 6 (six) hours as needed for wheezing or shortness of breath., Disp: , Rfl:  .  amLODipine (NORVASC) 2.5 MG tablet, Take 5 mg by mouth daily., Disp: , Rfl:  .  atorvastatin (LIPITOR) 10 MG tablet, Take 10 mg by mouth daily., Disp: , Rfl:  .  budesonide-formoterol (SYMBICORT) 160-4.5 MCG/ACT inhaler, Inhale 2 puffs into the lungs 2 (two) times daily., Disp: , Rfl:  .  buPROPion (WELLBUTRIN XL) 150 MG 24 hr tablet, Take 150 mg by mouth daily., Disp: , Rfl:  .  Ergocalciferol (VITAMIN D2) 50 MCG (2000 UT) TABS, Take 1 tablet by mouth daily., Disp: , Rfl:  .  furosemide (LASIX) 20 MG tablet, Take 20 mg by mouth daily., Disp: , Rfl:  .  metoprolol tartrate (LOPRESSOR) 50 MG tablet, , Disp: , Rfl:  .  mirtazapine (REMERON) 15 MG tablet, Take 15 mg by mouth at bedtime., Disp: , Rfl:  .  naproxen (NAPROSYN) 500 MG tablet, Take 500 mg by mouth 2 (two) times daily with a meal. (Patient not taking: Reported on 09/14/2020), Disp: , Rfl:  .  omeprazole (PRILOSEC) 20 MG capsule, Take 20 mg by mouth daily., Disp: , Rfl:  .  OXcarbazepine (TRILEPTAL) 150 MG  tablet, Take 150 mg by mouth 2 (two) times daily., Disp: , Rfl:  .  sildenafil (VIAGRA) 100 MG tablet, Take 100 mg by mouth as needed for erectile dysfunction., Disp: , Rfl:  .  tiotropium (SPIRIVA) 18 MCG inhalation capsule, Place 18 mcg into inhaler and inhale daily. (Patient not taking: Reported on 09/14/2020), Disp: , Rfl:   Past Medical History: Past Medical History:  Diagnosis Date  . Asthma     Tobacco Use: Social History   Tobacco Use  Smoking Status Former Smoker  . Packs/day: 0.50  . Years: 15.00  . Pack years: 7.50  . Types: Cigarettes  . Start date: 5  . Quit date: 25  . Years since quitting: 24.3  Smokeless Tobacco Never Used    Labs: Recent Review Flowsheet Data   There is no flowsheet data to display.     Capillary Blood Glucose: No results found for: GLUCAP   Pulmonary Assessment Scores:  Pulmonary Assessment Scores    Row Name 09/10/20 1349         ADL UCSD   SOB Score total 43           CAT Score   CAT Score 23           mMRC Score   mMRC Score 3           UCSD: Self-administered rating of dyspnea associated with activities of daily living (ADLs) 6-point scale (0 = "  not at all" to 5 = "maximal or unable to do because of breathlessness")  Scoring Scores range from 0 to 120.  Minimally important difference is 5 units  CAT: CAT can identify the health impairment of COPD patients and is better correlated with disease progression.  CAT has a scoring range of zero to 40. The CAT score is classified into four groups of low (less than 10), medium (10 - 20), high (21-30) and very high (31-40) based on the impact level of disease on health status. A CAT score over 10 suggests significant symptoms.  A worsening CAT score could be explained by an exacerbation, poor medication adherence, poor inhaler technique, or progression of COPD or comorbid conditions.  CAT MCID is 2 points  mMRC: mMRC (Modified Medical Research Council) Dyspnea Scale is  used to assess the degree of baseline functional disability in patients of respiratory disease due to dyspnea. No minimal important difference is established. A decrease in score of 1 point or greater is considered a positive change.   Pulmonary Function Assessment:  Pulmonary Function Assessment - 09/10/20 1342      Breath   Shortness of Breath Yes;Limiting activity           Exercise Target Goals: Exercise Program Goal: Individual exercise prescription set using results from initial 6 min walk test and THRR while considering  patient's activity barriers and safety.   Exercise Prescription Goal: Initial exercise prescription builds to 30-45 minutes a day of aerobic activity, 2-3 days per week.  Home exercise guidelines will be given to patient during program as part of exercise prescription that the participant will acknowledge.  Activity Barriers & Risk Stratification:  Activity Barriers & Cardiac Risk Stratification - 09/10/20 1348      Activity Barriers & Cardiac Risk Stratification   Activity Barriers Arthritis;Joint Problems;Deconditioning;Shortness of Breath    Cardiac Risk Stratification Low           6 Minute Walk:  6 Minute Walk    Row Name 09/10/20 1511         6 Minute Walk   Phase Initial     Distance 1100 feet     Walk Time 6 minutes     # of Rest Breaks 0     MPH 2.08     METS 1.99     RPE 12     Perceived Dyspnea  13     VO2 Peak 6.98     Symptoms No     Resting HR 65 bpm     Resting BP 120/74     Resting Oxygen Saturation  96 %     Exercise Oxygen Saturation  during 6 min walk 90 %     Max Ex. HR 85 bpm     Max Ex. BP 144/74     2 Minute Post BP 136/74           Interval HR   1 Minute HR 72     2 Minute HR 77     3 Minute HR 84     4 Minute HR 85     5 Minute HR 81     6 Minute HR 83     2 Minute Post HR 65     Interval Heart Rate? Yes           Interval Oxygen   Interval Oxygen? Yes     Baseline Oxygen Saturation % 96 %     1  Minute Oxygen  Saturation % 93 %     1 Minute Liters of Oxygen 0 L     2 Minute Oxygen Saturation % 91 %     2 Minute Liters of Oxygen 0 L     3 Minute Oxygen Saturation % 90 %     3 Minute Liters of Oxygen 0 L     4 Minute Oxygen Saturation % 90 %     4 Minute Liters of Oxygen 0 L     5 Minute Oxygen Saturation % 91 %     5 Minute Liters of Oxygen 0 L     6 Minute Oxygen Saturation % 91 %     6 Minute Liters of Oxygen 0 L     2 Minute Post Oxygen Saturation % 96 %     2 Minute Post Liters of Oxygen 0 L            Oxygen Initial Assessment:  Oxygen Initial Assessment - 09/10/20 1510      Initial 6 min Walk   Oxygen Used None      Program Oxygen Prescription   Program Oxygen Prescription None      Intervention   Short Term Goals To learn and exhibit compliance with exercise, home and travel O2 prescription;To learn and understand importance of monitoring SPO2 with pulse oximeter and demonstrate accurate use of the pulse oximeter.;To learn and understand importance of maintaining oxygen saturations>88%;To learn and demonstrate proper pursed lip breathing techniques or other breathing techniques.;To learn and demonstrate proper use of respiratory medications    Long  Term Goals Exhibits compliance with exercise, home and travel O2 prescription;Verbalizes importance of monitoring SPO2 with pulse oximeter and return demonstration;Maintenance of O2 saturations>88%;Exhibits proper breathing techniques, such as pursed lip breathing or other method taught during program session;Compliance with respiratory medication;Demonstrates proper use of MDI's           Oxygen Re-Evaluation:  Oxygen Re-Evaluation    Row Name 10/05/20 1600 11/02/20 1623           Program Oxygen Prescription   Program Oxygen Prescription None None             Home Oxygen   Home Oxygen Device E-Tanks;Home Concentrator E-Tanks;Home Concentrator      Sleep Oxygen Prescription None None      Home Exercise  Oxygen Prescription Continuous Continuous      Liters per minute 2 2      Home Resting Oxygen Prescription Continuous Continuous      Liters per minute 2 2      Compliance with Home Oxygen Use Yes Yes             Goals/Expected Outcomes   Short Term Goals To learn and exhibit compliance with exercise, home and travel O2 prescription;To learn and understand importance of monitoring SPO2 with pulse oximeter and demonstrate accurate use of the pulse oximeter.;To learn and understand importance of maintaining oxygen saturations>88%;To learn and demonstrate proper pursed lip breathing techniques or other breathing techniques.;To learn and demonstrate proper use of respiratory medications To learn and exhibit compliance with exercise, home and travel O2 prescription;To learn and understand importance of monitoring SPO2 with pulse oximeter and demonstrate accurate use of the pulse oximeter.;To learn and understand importance of maintaining oxygen saturations>88%;To learn and demonstrate proper pursed lip breathing techniques or other breathing techniques.;To learn and demonstrate proper use of respiratory medications      Long  Term Goals Exhibits compliance with exercise, home and  travel O2 prescription;Verbalizes importance of monitoring SPO2 with pulse oximeter and return demonstration;Maintenance of O2 saturations>88%;Exhibits proper breathing techniques, such as pursed lip breathing or other method taught during program session;Compliance with respiratory medication;Demonstrates proper use of MDI's Exhibits compliance with exercise, home and travel O2 prescription;Verbalizes importance of monitoring SPO2 with pulse oximeter and return demonstration;Maintenance of O2 saturations>88%;Exhibits proper breathing techniques, such as pursed lip breathing or other method taught during program session;Compliance with respiratory medication;Demonstrates proper use of MDI's             Oxygen Discharge (Final  Oxygen Re-Evaluation):  Oxygen Re-Evaluation - 11/02/20 1623      Program Oxygen Prescription   Program Oxygen Prescription None      Home Oxygen   Home Oxygen Device E-Tanks;Home Concentrator    Sleep Oxygen Prescription None    Home Exercise Oxygen Prescription Continuous    Liters per minute 2    Home Resting Oxygen Prescription Continuous    Liters per minute 2    Compliance with Home Oxygen Use Yes      Goals/Expected Outcomes   Short Term Goals To learn and exhibit compliance with exercise, home and travel O2 prescription;To learn and understand importance of monitoring SPO2 with pulse oximeter and demonstrate accurate use of the pulse oximeter.;To learn and understand importance of maintaining oxygen saturations>88%;To learn and demonstrate proper pursed lip breathing techniques or other breathing techniques.;To learn and demonstrate proper use of respiratory medications    Long  Term Goals Exhibits compliance with exercise, home and travel O2 prescription;Verbalizes importance of monitoring SPO2 with pulse oximeter and return demonstration;Maintenance of O2 saturations>88%;Exhibits proper breathing techniques, such as pursed lip breathing or other method taught during program session;Compliance with respiratory medication;Demonstrates proper use of MDI's           Initial Exercise Prescription:  Initial Exercise Prescription - 09/10/20 1500      Date of Initial Exercise RX and Referring Provider   Date 09/10/20    Referring Provider Dr. Gwenette Greet    Expected Discharge Date 01/13/21      NuStep   Level 1    SPM 60    Minutes 22      Arm Ergometer   Level 1    RPM 45    Minutes 17      Prescription Details   Frequency (times per week) 2    Duration Progress to 30 minutes of continuous aerobic without signs/symptoms of physical distress      Intensity   THRR 40-80% of Max Heartrate 59-118    Ratings of Perceived Exertion 11-13    Perceived Dyspnea 0-4       Resistance Training   Training Prescription Yes    Weight 3 lbs    Reps 10-15           Perform Capillary Blood Glucose checks as needed.  Exercise Prescription Changes:   Exercise Prescription Changes    Row Name 09/21/20 0748 10/05/20 1600 10/07/20 1600 10/12/20 1621 11/02/20 1600     Response to Exercise   Blood Pressure (Admit) 126/74 124/66 -- 132/64 122/60   Blood Pressure (Exercise) 144/82 130/72 -- 122/70 164/72   Blood Pressure (Exit) 108/74 140/76 -- 128/60 120/70   Heart Rate (Admit) 73 bpm 86 bpm -- 82 bpm 81 bpm   Heart Rate (Exercise) 85 bpm 90 bpm -- 103 bpm 90 bpm   Heart Rate (Exit) 71 bpm 82 bpm -- 80 bpm 82 bpm   Oxygen Saturation (Admit)  96 % 95 % -- 94 % 95 %   Oxygen Saturation (Exercise) 94 % 94 % -- 95 % 94 %   Oxygen Saturation (Exit) 96 % 98 % -- 95 % 96 %   Rating of Perceived Exertion (Exercise) 12 11 -- 13 9   Perceived Dyspnea (Exercise) 12 11 -- 13 9   Duration Continue with 30 min of aerobic exercise without signs/symptoms of physical distress. Continue with 30 min of aerobic exercise without signs/symptoms of physical distress. -- Continue with 30 min of aerobic exercise without signs/symptoms of physical distress. Continue with 30 min of aerobic exercise without signs/symptoms of physical distress.   Intensity THRR unchanged THRR unchanged -- THRR unchanged THRR unchanged     Progression   Progression Continue to progress workloads to maintain intensity without signs/symptoms of physical distress. Continue to progress workloads to maintain intensity without signs/symptoms of physical distress. -- Continue to progress workloads to maintain intensity without signs/symptoms of physical distress. Continue to progress workloads to maintain intensity without signs/symptoms of physical distress.     Resistance Training   Training Prescription Yes Yes -- Yes Yes   Weight 3 lbs 4 lbs -- 4 lbs 4 lbs   Reps 10-15 10-15 -- 10-15 10-15   Time 10 Minutes 10  Minutes -- 10 Minutes 10 Minutes     NuStep   Level 1 1 -- 1 1   SPM 68 61 -- 68 77   Minutes 22 22 -- 22 22   METs 1.9 1.8 -- 1.9 2.1     Arm Ergometer   Level 1 1 -- 1.5 2   RPM 44 41 -- 49 69   Minutes 17 17 -- 17 17   METs 1.5 1.5 -- 1.7 2.2     Home Exercise Plan   Plans to continue exercise at -- -- Home (comment) -- --   Frequency -- -- Add 2 additional days to program exercise sessions. -- --   Initial Home Exercises Provided -- -- 10/07/20 -- --          Exercise Comments:   Exercise Comments    Row Name 10/07/20 1602           Exercise Comments Reviewed home exercise program with patient today. He is not currently exercising at home. He is going to start walking around his block 2 days per week in addition to rehab.              Exercise Goals and Review:   Exercise Goals    Row Name 09/10/20 1514 10/05/20 1600 11/02/20 1622         Exercise Goals   Increase Physical Activity Yes Yes Yes     Intervention Provide advice, education, support and counseling about physical activity/exercise needs.;Develop an individualized exercise prescription for aerobic and resistive training based on initial evaluation findings, risk stratification, comorbidities and participant's personal goals. Provide advice, education, support and counseling about physical activity/exercise needs.;Develop an individualized exercise prescription for aerobic and resistive training based on initial evaluation findings, risk stratification, comorbidities and participant's personal goals. Provide advice, education, support and counseling about physical activity/exercise needs.;Develop an individualized exercise prescription for aerobic and resistive training based on initial evaluation findings, risk stratification, comorbidities and participant's personal goals.     Expected Outcomes Short Term: Attend rehab on a regular basis to increase amount of physical activity.;Long Term: Add in home  exercise to make exercise part of routine and to increase amount of physical  activity.;Long Term: Exercising regularly at least 3-5 days a week. Short Term: Attend rehab on a regular basis to increase amount of physical activity.;Long Term: Add in home exercise to make exercise part of routine and to increase amount of physical activity.;Long Term: Exercising regularly at least 3-5 days a week. Short Term: Attend rehab on a regular basis to increase amount of physical activity.;Long Term: Add in home exercise to make exercise part of routine and to increase amount of physical activity.;Long Term: Exercising regularly at least 3-5 days a week.     Increase Strength and Stamina Yes Yes Yes     Intervention Provide advice, education, support and counseling about physical activity/exercise needs.;Develop an individualized exercise prescription for aerobic and resistive training based on initial evaluation findings, risk stratification, comorbidities and participant's personal goals. Provide advice, education, support and counseling about physical activity/exercise needs.;Develop an individualized exercise prescription for aerobic and resistive training based on initial evaluation findings, risk stratification, comorbidities and participant's personal goals. Provide advice, education, support and counseling about physical activity/exercise needs.;Develop an individualized exercise prescription for aerobic and resistive training based on initial evaluation findings, risk stratification, comorbidities and participant's personal goals.     Expected Outcomes Short Term: Increase workloads from initial exercise prescription for resistance, speed, and METs.;Short Term: Perform resistance training exercises routinely during rehab and add in resistance training at home;Long Term: Improve cardiorespiratory fitness, muscular endurance and strength as measured by increased METs and functional capacity (6MWT) Short Term: Increase  workloads from initial exercise prescription for resistance, speed, and METs.;Short Term: Perform resistance training exercises routinely during rehab and add in resistance training at home;Long Term: Improve cardiorespiratory fitness, muscular endurance and strength as measured by increased METs and functional capacity (6MWT) Short Term: Increase workloads from initial exercise prescription for resistance, speed, and METs.;Short Term: Perform resistance training exercises routinely during rehab and add in resistance training at home;Long Term: Improve cardiorespiratory fitness, muscular endurance and strength as measured by increased METs and functional capacity (6MWT)     Able to understand and use rate of perceived exertion (RPE) scale Yes Yes Yes     Intervention Provide education and explanation on how to use RPE scale Provide education and explanation on how to use RPE scale Provide education and explanation on how to use RPE scale     Expected Outcomes Short Term: Able to use RPE daily in rehab to express subjective intensity level;Long Term:  Able to use RPE to guide intensity level when exercising independently Short Term: Able to use RPE daily in rehab to express subjective intensity level;Long Term:  Able to use RPE to guide intensity level when exercising independently Short Term: Able to use RPE daily in rehab to express subjective intensity level;Long Term:  Able to use RPE to guide intensity level when exercising independently     Able to understand and use Dyspnea scale Yes Yes Yes     Intervention Provide education and explanation on how to use Dyspnea scale Provide education and explanation on how to use Dyspnea scale Provide education and explanation on how to use Dyspnea scale     Expected Outcomes Short Term: Able to use Dyspnea scale daily in rehab to express subjective sense of shortness of breath during exertion;Long Term: Able to use Dyspnea scale to guide intensity level when  exercising independently Short Term: Able to use Dyspnea scale daily in rehab to express subjective sense of shortness of breath during exertion;Long Term: Able to use Dyspnea scale to guide  intensity level when exercising independently Short Term: Able to use Dyspnea scale daily in rehab to express subjective sense of shortness of breath during exertion;Long Term: Able to use Dyspnea scale to guide intensity level when exercising independently     Knowledge and understanding of Target Heart Rate Range (THRR) Yes Yes Yes     Intervention Provide education and explanation of THRR including how the numbers were predicted and where they are located for reference Provide education and explanation of THRR including how the numbers were predicted and where they are located for reference Provide education and explanation of THRR including how the numbers were predicted and where they are located for reference     Expected Outcomes Short Term: Able to state/look up THRR;Long Term: Able to use THRR to govern intensity when exercising independently;Short Term: Able to use daily as guideline for intensity in rehab Short Term: Able to state/look up THRR;Long Term: Able to use THRR to govern intensity when exercising independently;Short Term: Able to use daily as guideline for intensity in rehab Short Term: Able to state/look up THRR;Long Term: Able to use THRR to govern intensity when exercising independently;Short Term: Able to use daily as guideline for intensity in rehab     Understanding of Exercise Prescription Yes Yes Yes     Intervention Provide education, explanation, and written materials on patient's individual exercise prescription Provide education, explanation, and written materials on patient's individual exercise prescription Provide education, explanation, and written materials on patient's individual exercise prescription     Expected Outcomes Short Term: Able to explain program exercise prescription;Long  Term: Able to explain home exercise prescription to exercise independently Short Term: Able to explain program exercise prescription;Long Term: Able to explain home exercise prescription to exercise independently Short Term: Able to explain program exercise prescription;Long Term: Able to explain home exercise prescription to exercise independently            Exercise Goals Re-Evaluation :  Exercise Goals Re-Evaluation    Row Name 10/05/20 1600 11/02/20 1600           Exercise Goal Re-Evaluation   Exercise Goals Review Increase Physical Activity;Increase Strength and Stamina;Able to understand and use rate of perceived exertion (RPE) scale;Able to understand and use Dyspnea scale;Knowledge and understanding of Target Heart Rate Range (THRR);Able to check pulse independently;Understanding of Exercise Prescription Increase Physical Activity;Increase Strength and Stamina;Able to understand and use rate of perceived exertion (RPE) scale;Able to understand and use Dyspnea scale;Knowledge and understanding of Target Heart Rate Range (THRR);Able to check pulse independently;Understanding of Exercise Prescription      Comments Patient has completed 5 exercise sessions. He has tolerated exercise well. He uses his rescue inhaler most days before the second station. He has not increased intensities with aerobic training since he started the program. He still takes some breaks and gets short of breath. He is currently working at 1.8 METs on the NuStep. Will continue to monitor and progress as able. Patient has completed 9 exercise sessions. He has had inconsistent attendance and is progressing slowly. He is slowly increasing intensities and his MET level is increasing as well. He has reported that he feels stronger since starting the program. He says that walking is easier with decreased shortness of breath at home. He is currently exercising at 2.1 METs on the NuStep. Will continue to monitor and progress as  able.      Expected Outcomes Through exercise at rehab and with a home exercise program, patient will achieve their  goals. Through exercise at rehab and with a home exercise program, patient will achieve their goals.             Discharge Exercise Prescription (Final Exercise Prescription Changes):  Exercise Prescription Changes - 11/02/20 1600      Response to Exercise   Blood Pressure (Admit) 122/60    Blood Pressure (Exercise) 164/72    Blood Pressure (Exit) 120/70    Heart Rate (Admit) 81 bpm    Heart Rate (Exercise) 90 bpm    Heart Rate (Exit) 82 bpm    Oxygen Saturation (Admit) 95 %    Oxygen Saturation (Exercise) 94 %    Oxygen Saturation (Exit) 96 %    Rating of Perceived Exertion (Exercise) 9    Perceived Dyspnea (Exercise) 9    Duration Continue with 30 min of aerobic exercise without signs/symptoms of physical distress.    Intensity THRR unchanged      Progression   Progression Continue to progress workloads to maintain intensity without signs/symptoms of physical distress.      Resistance Training   Training Prescription Yes    Weight 4 lbs    Reps 10-15    Time 10 Minutes      NuStep   Level 1    SPM 77    Minutes 22    METs 2.1      Arm Ergometer   Level 2    RPM 69    Minutes 17    METs 2.2           Nutrition:  Target Goals: Understanding of nutrition guidelines, daily intake of sodium '1500mg'$ , cholesterol '200mg'$ , calories 30% from fat and 7% or less from saturated fats, daily to have 5 or more servings of fruits and vegetables.  Biometrics:  Pre Biometrics - 11/02/20 1622      Pre Biometrics   Weight 108.7 kg            Nutrition Therapy Plan and Nutrition Goals:  Nutrition Therapy & Goals - 09/29/20 1451      Personal Nutrition Goals   Comments Patient scored 85 on his diet assessment. We provide 2 educational sessions with handouts on heart healthy nutrition and offer RD referral.      Intervention Plan   Intervention Nutrition  handout(s) given to patient.           Nutrition Assessments:  Nutrition Assessments - 09/10/20 1353      MEDFICTS Scores   Pre Score 85          MEDIFICTS Score Key:  ?70 Need to make dietary changes   40-70 Heart Healthy Diet  ? 40 Therapeutic Level Cholesterol Diet   Picture Your Plate Scores:  <14 Unhealthy dietary pattern with much room for improvement.  41-50 Dietary pattern unlikely to meet recommendations for good health and room for improvement.  51-60 More healthful dietary pattern, with some room for improvement.   >60 Healthy dietary pattern, although there may be some specific behaviors that could be improved.    Nutrition Goals Re-Evaluation:   Nutrition Goals Discharge (Final Nutrition Goals Re-Evaluation):   Psychosocial: Target Goals: Acknowledge presence or absence of significant depression and/or stress, maximize coping skills, provide positive support system. Participant is able to verbalize types and ability to use techniques and skills needed for reducing stress and depression.  Initial Review & Psychosocial Screening:  Initial Psych Review & Screening - 09/10/20 1353      Initial Review   Current issues  with None Identified      Family Dynamics   Good Support System? Yes    Comments He has a couple of friends who he grew up with and was in the [employer] with that have always been there for him. He has a sister who he speaks with frequently and lives close by. Overall he feels like he has a good support system.      Barriers   Psychosocial barriers to participate in program The patient should benefit from training in stress management and relaxation.      Screening Interventions   Interventions Encouraged to exercise;Provide feedback about the scores to participant    Expected Outcomes Long Term Goal: Stressors or current issues are controlled or eliminated.;Short Term goal: Identification and review with participant of any Quality of Life  or Depression concerns found by scoring the questionnaire.;Long Term goal: The participant improves quality of Life and PHQ9 Scores as seen by post scores and/or verbalization of changes           Quality of Life Scores:  Quality of Life - 09/10/20 1516      Quality of Life   Select Quality of Life      Quality of Life Scores   Health/Function Pre 14.85 %    Socioeconomic Pre 20.14 %    Psych/Spiritual Pre 26.42 %    Family Pre 13.5 %    GLOBAL Pre 18.06 %          Scores of 19 and below usually indicate a poorer quality of life in these areas.  A difference of  2-3 points is a clinically meaningful difference.  A difference of 2-3 points in the total score of the Quality of Life Index has been associated with significant improvement in overall quality of life, self-image, physical symptoms, and general health in studies assessing change in quality of life.   PHQ-9: Recent Review Flowsheet Data    Depression screen [facility] 2/9 09/10/2020   Decreased Interest 1   Down, Depressed, Hopeless 1   PHQ - 2 Score 2   Altered sleeping 1   Tired, decreased energy 1   Change in appetite 2   Feeling bad or failure about yourself  0   Trouble concentrating 1   Moving slowly or fidgety/restless 0   Suicidal thoughts 1    PHQ-9 Score 8   Difficult doing work/chores Somewhat difficult     Interpretation of Total Score  Total Score Depression Severity:  1-4 = Minimal depression, 5-9 = Mild depression, 10-14 = Moderate depression, 15-19 = Moderately severe depression, 20-27 = Severe depression   Psychosocial Evaluation and Intervention:  Psychosocial Evaluation - 09/10/20 1517      Psychosocial Evaluation & Interventions   Interventions Stress management education;Relaxation education;Encouraged to exercise with the program and follow exercise prescription    Comments Patient has no barriers in attending the pulmonary rehab program. He is unsure if he wants to do the full 18 weeks, but  states that he will do at least 12. Patient lives alone, but does report that he has a good support system. He has several friends in the area which he has been friends with since he was in high school, and some of them served in [employer] with him. He also has a sister that lives near him who he speaks to regularly. His PHQ-9 was an 9, and his overall QOL was a 18.06. He states that he no longer has any hobbies, as he  has gotten older, he feels like he no longer has the ability or energy to engage in his hobbies anymore. His hobbies included working on cars and racing cars. He states that his makes him feel like he does not have a purpose at times, and wonders if it would matter if he was dead. However, he states that he does not have any suicidal thoughts and has no plans of hurting himself. He declined a referral to a counselor and states that he copes well on his own. I think he will benefit from exercise and being around other people. I encouraged him to try to find other hobbies that were not as physically demanding. His overall goals in the program are to lose some weight and to breathe better.    Expected Outcomes The patient's psychosocial issues will decrease as he becomes stronger by attending pulmonary rehab. Through stress management, relaxation management, and exercise, the patient will begin to feel his sense of purpose again, and his QOL will increase.    Continue Psychosocial Services  Follow up required by staff           Psychosocial Re-Evaluation:  Psychosocial Re-Evaluation    Row Name 09/29/20 1453 10/28/20 0939           Psychosocial Re-Evaluation   Current issues with None Identified None Identified      Comments Patient is new to the program completing 3 sessions. He stated during his orientation he had feelings of having no purpose at times duet to not being able to do the things he used to do. He does not have a diagnosis of depression or history of depression but is taking  Mirtazepine and wellbutrin. Will continue to monitor. Patient is new to the program completing 8 sessions. He stated during his orientation he had feelings of having no purpose at times due to not being able to do the things he used to do. He does not have a diagnosis of depression or history of depression but is taking Mirtazepine and wellbutrin. Will continue to monitor.      Expected Outcomes Patient will continue to have no psychosocial issues identififed. Patient will continue to have no psychosocial issues identififed.      Interventions Stress management education;Encouraged to attend Pulmonary Rehabilitation for the exercise;Relaxation education Stress management education;Encouraged to attend Pulmonary Rehabilitation for the exercise;Relaxation education      Continue Psychosocial Services  No Follow up required No Follow up required             Psychosocial Discharge (Final Psychosocial Re-Evaluation):  Psychosocial Re-Evaluation - 10/28/20 0939      Psychosocial Re-Evaluation   Current issues with None Identified    Comments Patient is new to the program completing 8 sessions. He stated during his orientation he had feelings of having no purpose at times due to not being able to do the things he used to do. He does not have a diagnosis of depression or history of depression but is taking Mirtazepine and wellbutrin. Will continue to monitor.    Expected Outcomes Patient will continue to have no psychosocial issues identififed.    Interventions Stress management education;Encouraged to attend Pulmonary Rehabilitation for the exercise;Relaxation education    Continue Psychosocial Services  No Follow up required            Education: Education Goals: Education classes will be provided on a weekly basis, covering required topics. Participant will state understanding/return demonstration of topics presented.  Learning Barriers/Preferences:  Learning Barriers/Preferences - 09/10/20  1355      Learning Barriers/Preferences   Learning Barriers None    Learning Preferences Skilled Demonstration;Pictoral;Written Material           Education Topics: How Lungs Work and Diseases: - Discuss the anatomy of the lungs and diseases that can affect the lungs, such as COPD.   Exercise: -Discuss the importance of exercise, FITT principles of exercise, normal and abnormal responses to exercise, and how to exercise safely.   Environmental Irritants: -Discuss types of environmental irritants and how to limit exposure to environmental irritants.   Meds/Inhalers and oxygen: - Discuss respiratory medications, definition of an inhaler and oxygen, and the proper way to use an inhaler and oxygen. Flowsheet Row PULMONARY REHAB CHRONIC OBSTRUCTIVE PULMONARY DISEASE from 10/21/2020 in [place]  Date 10/21/20  Educator KM      Energy Saving Techniques: - Discuss methods to conserve energy and decrease shortness of breath when performing activities of daily living.    Bronchial Hygiene / Breathing Techniques: - Discuss breathing mechanics, pursed-lip breathing technique,  proper posture, effective ways to clear airways, and other functional breathing techniques   Cleaning Equipment: - Provides group verbal and written instruction about the health risks of elevated stress, cause of high stress, and healthy ways to reduce stress.   Nutrition I: Fats: - Discuss the types of cholesterol, what cholesterol does to the body, and how cholesterol levels can be controlled.   Nutrition II: Labels: -Discuss the different components of food labels and how to read food labels.   Respiratory Infections: - Discuss the signs and symptoms of respiratory infections, ways to prevent respiratory infections, and the importance of seeking medical treatment when having a respiratory infection.   Stress I: Signs and Symptoms: - Discuss the causes of stress, how stress  may lead to anxiety and depression, and ways to limit stress.   Stress II: Relaxation: -Discuss relaxation techniques to limit stress.   Oxygen for Home/Travel: - Discuss how to prepare for travel when on oxygen and proper ways to transport and store oxygen to ensure safety. Flowsheet Row PULMONARY REHAB CHRONIC OBSTRUCTIVE PULMONARY DISEASE from 10/21/2020 in [place]  Date 09/23/20  Educator DJ  Instruction Review Code 1- Verbalizes Understanding      Knowledge Questionnaire Score:  Knowledge Questionnaire Score - 09/10/20 1405      Knowledge Questionnaire Score   Pre Score 16/18           Core Components/Risk Factors/Patient Goals at Admission:  Personal Goals and Risk Factors at Admission - 09/10/20 1356      Core Components/Risk Factors/Patient Goals on Admission    Weight Management Yes;Weight Loss    Intervention Weight Management: Develop a combined nutrition and exercise program designed to reach desired caloric intake, while maintaining appropriate intake of nutrient and fiber, sodium and fats, and appropriate energy expenditure required for the weight goal.;Weight Management: Provide education and appropriate resources to help participant work on and attain dietary goals.;Weight Management/Obesity: Establish reasonable short term and long term weight goals.;Obesity: Provide education and appropriate resources to help participant work on and attain dietary goals.    Expected Outcomes Short Term: Continue to assess and modify interventions until short term weight is achieved;Long Term: Adherence to nutrition and physical activity/exercise program aimed toward attainment of established weight goal;Weight Loss: Understanding of general recommendations for a balanced deficit meal plan, which promotes 1-2 lb weight loss per week and includes a negative energy  balance of [phone] kcal/d;Understanding of distribution of calorie intake throughout the day with  the consumption of 4-5 meals/snacks;Understanding recommendations for meals to include 15-35% energy as protein, 25-35% energy from fat, 35-60% energy from carbohydrates, less than $RemoveB'200mg'[id number]$  of dietary cholesterol, 20-35 gm of total fiber daily    Improve shortness of breath with ADL's Yes    Intervention Provide education, individualized exercise plan and daily activity instruction to help decrease symptoms of SOB with activities of daily living.    Expected Outcomes Short Term: Improve cardiorespiratory fitness to achieve a reduction of symptoms when performing ADLs;Long Term: Be able to perform more ADLs without symptoms or delay the onset of symptoms           Core Components/Risk Factors/Patient Goals Review:   Goals and Risk Factor Review    Row Name 09/29/20 1500 10/28/20 0940           Core Components/Risk Factors/Patient Goals Review   Personal Goals Review Improve shortness of breath with ADL's;Other Improve shortness of breath with ADL's;Other      Review Patient is new to the program completing 3 sessions. He was referred by the [facility] with COPD. His personal goals for the program are to get stronger and improve his breathing. We will continue to monitor his progress as he works toward meeting these goals. Patient has completed 8 sessions gaining 10 lbs since his initial visit. He is doing well in the program. His attendance okay. He has missed the last 2 sessions. He has been progressed. His blood pressure is usually at goal below 130/80. His O2 saturations usually remain above 94% on RA. His personal goals for the program are to get stronger and improve his breathing. We will continue to monitor his progress as he works toward meeting these goals.      Expected Outcomes Patient will complete the program meeting both program and personal goals. Patient will complete the program meeting both program and personal goals.             Core Components/Risk Factors/Patient Goals at Discharge  (Final Review):   Goals and Risk Factor Review - 10/28/20 0940      Core Components/Risk Factors/Patient Goals Review   Personal Goals Review Improve shortness of breath with ADL's;Other    Review Patient has completed 8 sessions gaining 10 lbs since his initial visit. He is doing well in the program. His attendance okay. He has missed the last 2 sessions. He has been progressed. His blood pressure is usually at goal below 130/80. His O2 saturations usually remain above 94% on RA. His personal goals for the program are to get stronger and improve his breathing. We will continue to monitor his progress as he works toward meeting these goals.    Expected Outcomes Patient will complete the program meeting both program and personal goals.           ITP Comments:   Comments: ITP REVIEW Pt is making expected progress toward pulmonary rehab goals after completing 10 sessions. Recommend continued exercise, life style modification, education, and utilization of breathing techniques to increase stamina and strength and decrease shortness of breath with exertion.

## 2020-11-09 ENCOUNTER — Other Ambulatory Visit: Payer: Self-pay

## 2020-11-09 ENCOUNTER — Encounter (HOSPITAL_COMMUNITY)
Admission: RE | Admit: 2020-11-09 | Discharge: 2020-11-09 | Disposition: A | Discharge status: Home / Self Care | Payer: No Typology Code available for payment source | Source: Ambulatory Visit | Attending: Pulmonary Disease | Admitting: Pulmonary Disease

## 2020-11-09 DIAGNOSIS — J449 Chronic obstructive pulmonary disease, unspecified: Secondary | ICD-10-CM | POA: Diagnosis not present

## 2020-11-09 NOTE — Progress Notes (Signed)
Daily Session Note  Patient Details  Name: Wesley Daugherty MRN: [id number] Date of Birth: 1945/07/27 Referring Provider:   Flowsheet Row PULMONARY REHAB COPD ORIENTATION from 09/10/2020 in [place]  Referring Provider Dr. Gwenette Greet      Encounter Date: 11/09/2020  Check In:  Session Check In - 11/09/20 1445      Check-In   Supervising physician immediately available to respond to emergencies [facility] MD immediately available    Physician(s) Dr. Domenic Polite    Location [facility]    Staff Present Cathren Harsh, MS, Exercise Physiologist;Ayeshia Coppin Kris Mouton, MS, ACSM-CEP, Exercise Physiologist;Phyllis Billingsley, RN    Virtual Visit No    Medication changes reported     No    Fall or balance concerns reported    No    Tobacco Cessation No Change    Warm-up and Cool-down Performed as group-led instruction    Resistance Training Performed Yes    VAD Patient? No    PAD/SET Patient? No      Pain Assessment   Currently in Pain? No/denies    Pain Score 0-No pain    Multiple Pain Sites No           Capillary Blood Glucose: No results found for this or any previous visit (from the past 24 hour(s)).    Social History   Tobacco Use  Smoking Status Former Smoker  . Packs/day: 0.50  . Years: 15.00  . Pack years: 7.50  . Types: Cigarettes  . Start date: 2  . Quit date: 47  . Years since quitting: 24.3  Smokeless Tobacco Never Used    Goals Met:  Independence with exercise equipment Exercise tolerated well No report of cardiac concerns or symptoms Strength training completed today  Goals Unmet:  N/A  Comments: checkout time is 1600   Dr. Kathie Dike is Medical Director for [facility] Pulmonary Rehab.

## 2020-11-11 ENCOUNTER — Other Ambulatory Visit: Payer: Self-pay

## 2020-11-11 ENCOUNTER — Encounter (HOSPITAL_COMMUNITY)
Admission: RE | Admit: 2020-11-11 | Discharge: 2020-11-11 | Disposition: A | Discharge status: Home / Self Care | Payer: No Typology Code available for payment source | Source: Ambulatory Visit | Attending: Pulmonary Disease | Admitting: Pulmonary Disease

## 2020-11-11 DIAGNOSIS — J449 Chronic obstructive pulmonary disease, unspecified: Secondary | ICD-10-CM

## 2020-11-11 NOTE — Progress Notes (Addendum)
Daily Session Note  Patient Details  Name: Wesley Daugherty MRN: [id number] Date of Birth: 15-Dec-1945 Referring Provider:   Flowsheet Row PULMONARY REHAB COPD ORIENTATION from 09/10/2020 in [place]  Referring Provider Dr. Gwenette Greet      Encounter Date: 11/11/2020  Check In:  Session Check In - 11/11/20 1500      Check-In   Supervising physician immediately available to respond to emergencies [facility] MD immediately available    Physician(s) Dr. Domenic Polite    Location [facility]    Staff Present Aundra Dubin, RN, BSN;Phyllis Billingsley, RN    Virtual Visit No    Medication changes reported     No    Fall or balance concerns reported    No    Tobacco Cessation No Change    Warm-up and Cool-down Performed as group-led instruction    Resistance Training Performed Yes    VAD Patient? No    PAD/SET Patient? No      Pain Assessment   Currently in Pain? No/denies    Pain Score 0-No pain    Multiple Pain Sites No           Capillary Blood Glucose: No results found for this or any previous visit (from the past 24 hour(s)).    Social History   Tobacco Use  Smoking Status Former Smoker  . Packs/day: 0.50  . Years: 15.00  . Pack years: 7.50  . Types: Cigarettes  . Start date: 31  . Quit date: 9  . Years since quitting: 24.3  Smokeless Tobacco Never Used    Goals Met:  Proper associated with RPD/PD & O2 Sat Independence with exercise equipment Improved SOB with ADL's Using PLB without cueing & demonstrates good technique Exercise tolerated well No report of cardiac concerns or symptoms Strength training completed today  Goals Unmet:  Not Applicable  Comments: Check out 1600.   Dr. Kathie Dike is Medical Director for [facility] Pulmonary Rehab.

## 2020-11-14 DIAGNOSIS — J449 Chronic obstructive pulmonary disease, unspecified: Secondary | ICD-10-CM | POA: Diagnosis not present

## 2020-11-14 DIAGNOSIS — J9601 Acute respiratory failure with hypoxia: Secondary | ICD-10-CM | POA: Diagnosis not present

## 2020-11-16 ENCOUNTER — Other Ambulatory Visit: Payer: Self-pay

## 2020-11-16 ENCOUNTER — Encounter (HOSPITAL_COMMUNITY)
Admission: RE | Admit: 2020-11-16 | Discharge: 2020-11-16 | Disposition: A | Discharge status: Home / Self Care | Payer: No Typology Code available for payment source | Source: Ambulatory Visit | Attending: Pulmonary Disease | Admitting: Pulmonary Disease

## 2020-11-16 VITALS — Wt 242.3 lb

## 2020-11-16 DIAGNOSIS — J449 Chronic obstructive pulmonary disease, unspecified: Secondary | ICD-10-CM

## 2020-11-16 NOTE — Progress Notes (Signed)
Daily Session Note  Patient Details  Name: Wesley Daugherty MRN: [id number] Date of Birth: 07/27/1945 Referring Provider:   Flowsheet Row PULMONARY REHAB COPD ORIENTATION from 09/10/2020 in [place]  Referring Provider Dr. Gwenette Greet      Encounter Date: 11/16/2020  Check In:  Session Check In - 11/16/20 1459      Check-In   Supervising physician immediately available to respond to emergencies [facility] MD immediately available    Physician(s) Dr. Johnsie Cancel    Location [facility]    Staff Present Cathren Harsh, MS, Exercise Physiologist;Arelly Whittenberg Kris Mouton, MS, ACSM-CEP, Exercise Physiologist    Virtual Visit No    Medication changes reported     No    Fall or balance concerns reported    No    Tobacco Cessation No Change    Warm-up and Cool-down Performed as group-led instruction    Resistance Training Performed Yes    VAD Patient? No    PAD/SET Patient? No      Pain Assessment   Currently in Pain? No/denies    Pain Score 0-No pain    Multiple Pain Sites No           Capillary Blood Glucose: No results found for this or any previous visit (from the past 24 hour(s)).    Social History   Tobacco Use  Smoking Status Former Smoker  . Packs/day: 0.50  . Years: 15.00  . Pack years: 7.50  . Types: Cigarettes  . Start date: 29  . Quit date: 70  . Years since quitting: 24.3  Smokeless Tobacco Never Used    Goals Met:  Independence with exercise equipment Exercise tolerated well No report of cardiac concerns or symptoms Strength training completed today  Goals Unmet:  Not Applicable  Comments: checkout time is 1600   Dr. Kathie Dike is Medical Director for [facility] Pulmonary Rehab.

## 2020-11-18 ENCOUNTER — Other Ambulatory Visit: Payer: Self-pay

## 2020-11-18 ENCOUNTER — Encounter (HOSPITAL_COMMUNITY)
Admission: RE | Admit: 2020-11-18 | Discharge: 2020-11-18 | Disposition: A | Discharge status: Home / Self Care | Payer: No Typology Code available for payment source | Source: Ambulatory Visit | Attending: Pulmonary Disease | Admitting: Pulmonary Disease

## 2020-11-18 DIAGNOSIS — J449 Chronic obstructive pulmonary disease, unspecified: Secondary | ICD-10-CM

## 2020-11-18 NOTE — Progress Notes (Signed)
Daily Session Note  Patient Details  Name: Wesley Daugherty MRN: [id number] Date of Birth: February 22, 1946 Referring Provider:   Flowsheet Row PULMONARY REHAB COPD ORIENTATION from 09/10/2020 in [place]  Referring Provider Dr. Gwenette Greet      Encounter Date: 11/18/2020  Check In:  Session Check In - 11/18/20 1455      Check-In   Supervising physician immediately available to respond to emergencies [facility] MD immediately available    Physician(s) Dr. Harl Bowie    Location [facility]    Staff Present Aundra Dubin, RN, BSN;[place] Audria Nine, MS, Exercise Physiologist    Virtual Visit No    Medication changes reported     No    Fall or balance concerns reported    No    Tobacco Cessation No Change    Warm-up and Cool-down Performed as group-led instruction    Resistance Training Performed Yes    VAD Patient? No    PAD/SET Patient? No      Pain Assessment   Currently in Pain? No/denies    Pain Score 0-No pain    Multiple Pain Sites No           Capillary Blood Glucose: No results found for this or any previous visit (from the past 24 hour(s)).    Social History   Tobacco Use  Smoking Status Former Smoker  . Packs/day: 0.50  . Years: 15.00  . Pack years: 7.50  . Types: Cigarettes  . Start date: 61  . Quit date: 4  . Years since quitting: 24.3  Smokeless Tobacco Never Used    Goals Met:  Proper associated with RPD/PD & O2 Sat Independence with exercise equipment Improved SOB with ADL's Using PLB without cueing & demonstrates good technique Exercise tolerated well No report of cardiac concerns or symptoms Strength training completed today  Goals Unmet:  Not Applicable  Comments: Check out 1600.   Dr. Kathie Dike is Medical Director for [facility] Pulmonary Rehab.

## 2020-11-23 ENCOUNTER — Encounter (HOSPITAL_COMMUNITY)
Admission: RE | Admit: 2020-11-23 | Discharge: 2020-11-23 | Disposition: A | Discharge status: Home / Self Care | Payer: No Typology Code available for payment source | Source: Ambulatory Visit | Attending: Pulmonary Disease | Admitting: Pulmonary Disease

## 2020-11-23 ENCOUNTER — Other Ambulatory Visit: Payer: Self-pay

## 2020-11-23 VITALS — Wt 242.9 lb

## 2020-11-23 DIAGNOSIS — J449 Chronic obstructive pulmonary disease, unspecified: Secondary | ICD-10-CM

## 2020-11-23 NOTE — Progress Notes (Signed)
Daily Session Note  Patient Details  Name: Wesley Daugherty MRN: [id number] Date of Birth: 1945-11-07 Referring Provider:   Flowsheet Row PULMONARY REHAB COPD ORIENTATION from 09/10/2020 in [place]  Referring Provider Dr. Gwenette Greet      Encounter Date: 11/23/2020  Check In:  Session Check In - 11/23/20 1500      Check-In   Supervising physician immediately available to respond to emergencies [facility] MD immediately available    Physician(s) Dr.. Domenic Polite    Location [facility]    Staff Present Geanie Cooley, RN;Madison Audria Nine, MS, Exercise Physiologist    Virtual Visit No    Medication changes reported     No    Fall or balance concerns reported    No    Tobacco Cessation No Change    Warm-up and Cool-down Performed as group-led instruction    Resistance Training Performed Yes    VAD Patient? No    PAD/SET Patient? No      Pain Assessment   Currently in Pain? No/denies    Pain Score 0-No pain    Multiple Pain Sites No           Capillary Blood Glucose: No results found for this or any previous visit (from the past 24 hour(s)).    Social History   Tobacco Use  Smoking Status Former Smoker  . Packs/day: 0.50  . Years: 15.00  . Pack years: 7.50  . Types: Cigarettes  . Start date: 55  . Quit date: 15  . Years since quitting: 24.4  Smokeless Tobacco Never Used    Goals Met:  Independence with exercise equipment Using PLB without cueing & demonstrates good technique Exercise tolerated well No report of cardiac concerns or symptoms Strength training completed today  Goals Unmet:  Not Applicable  Comments: check out @ 4PM   Dr. Kathie Dike is Medical Director for [facility].

## 2020-11-25 ENCOUNTER — Encounter (HOSPITAL_COMMUNITY): Payer: No Typology Code available for payment source

## 2020-11-30 ENCOUNTER — Encounter (HOSPITAL_COMMUNITY): Payer: No Typology Code available for payment source

## 2020-12-02 ENCOUNTER — Encounter (HOSPITAL_COMMUNITY): Payer: No Typology Code available for payment source

## 2020-12-02 NOTE — Progress Notes (Signed)
Pulmonary Individual Treatment Plan  Patient Details  Name: Wesley Daugherty MRN: [id number] Date of Birth: Aug 14, 1945 Referring Provider:   Flowsheet Row PULMONARY REHAB COPD ORIENTATION from 09/10/2020 in [place]  Referring Provider Dr. Gwenette Greet      Initial Encounter Date:  Flowsheet Row PULMONARY REHAB COPD ORIENTATION from 09/10/2020 in [place]  Date 09/10/20      Visit Diagnosis: Chronic obstructive pulmonary disease, unspecified COPD type ([place])  Patient's Home Medications on Admission:   Current Outpatient Medications:  .  albuterol (PROVENTIL HFA;VENTOLIN HFA) 108 (90 Base) MCG/ACT inhaler, Inhale 2 puffs into the lungs every 6 (six) hours as needed for wheezing or shortness of breath., Disp: , Rfl:  .  albuterol (PROVENTIL) (5 MG/ML) 0.5% nebulizer solution, Take 2.5 mg by nebulization every 6 (six) hours as needed for wheezing or shortness of breath., Disp: , Rfl:  .  amLODipine (NORVASC) 2.5 MG tablet, Take 5 mg by mouth daily., Disp: , Rfl:  .  atorvastatin (LIPITOR) 10 MG tablet, Take 10 mg by mouth daily., Disp: , Rfl:  .  budesonide-formoterol (SYMBICORT) 160-4.5 MCG/ACT inhaler, Inhale 2 puffs into the lungs 2 (two) times daily., Disp: , Rfl:  .  buPROPion (WELLBUTRIN XL) 150 MG 24 hr tablet, Take 150 mg by mouth daily., Disp: , Rfl:  .  Ergocalciferol (VITAMIN D2) 50 MCG (2000 UT) TABS, Take 1 tablet by mouth daily., Disp: , Rfl:  .  furosemide (LASIX) 20 MG tablet, Take 20 mg by mouth daily., Disp: , Rfl:  .  metoprolol tartrate (LOPRESSOR) 50 MG tablet, , Disp: , Rfl:  .  mirtazapine (REMERON) 15 MG tablet, Take 15 mg by mouth at bedtime., Disp: , Rfl:  .  naproxen (NAPROSYN) 500 MG tablet, Take 500 mg by mouth 2 (two) times daily with a meal. (Patient not taking: Reported on 09/14/2020), Disp: , Rfl:  .  omeprazole (PRILOSEC) 20 MG capsule, Take 20 mg by mouth daily., Disp: , Rfl:  .  OXcarbazepine (TRILEPTAL) 150 MG  tablet, Take 150 mg by mouth 2 (two) times daily., Disp: , Rfl:  .  sildenafil (VIAGRA) 100 MG tablet, Take 100 mg by mouth as needed for erectile dysfunction., Disp: , Rfl:  .  tiotropium (SPIRIVA) 18 MCG inhalation capsule, Place 18 mcg into inhaler and inhale daily. (Patient not taking: Reported on 09/14/2020), Disp: , Rfl:   Past Medical History: Past Medical History:  Diagnosis Date  . Asthma     Tobacco Use: Social History   Tobacco Use  Smoking Status Former Smoker  . Packs/day: 0.50  . Years: 15.00  . Pack years: 7.50  . Types: Cigarettes  . Start date: 82  . Quit date: 61  . Years since quitting: 24.4  Smokeless Tobacco Never Used    Labs: Recent Review Flowsheet Data   There is no flowsheet data to display.     Capillary Blood Glucose: No results found for: GLUCAP   Pulmonary Assessment Scores:  Pulmonary Assessment Scores    Row Name 09/10/20 1349         ADL UCSD   SOB Score total 43           CAT Score   CAT Score 23           mMRC Score   mMRC Score 3           UCSD: Self-administered rating of dyspnea associated with activities of daily living (ADLs) 6-point scale (0 = "  not at all" to 5 = "maximal or unable to do because of breathlessness")  Scoring Scores range from 0 to 120.  Minimally important difference is 5 units  CAT: CAT can identify the health impairment of COPD patients and is better correlated with disease progression.  CAT has a scoring range of zero to 40. The CAT score is classified into four groups of low (less than 10), medium (10 - 20), high (21-30) and very high (31-40) based on the impact level of disease on health status. A CAT score over 10 suggests significant symptoms.  A worsening CAT score could be explained by an exacerbation, poor medication adherence, poor inhaler technique, or progression of COPD or comorbid conditions.  CAT MCID is 2 points  mMRC: mMRC (Modified Medical Research Council) Dyspnea Scale is  used to assess the degree of baseline functional disability in patients of respiratory disease due to dyspnea. No minimal important difference is established. A decrease in score of 1 point or greater is considered a positive change.   Pulmonary Function Assessment:  Pulmonary Function Assessment - 09/10/20 1342      Breath   Shortness of Breath Yes;Limiting activity           Exercise Target Goals: Exercise Program Goal: Individual exercise prescription set using results from initial 6 min walk test and THRR while considering  patient's activity barriers and safety.   Exercise Prescription Goal: Initial exercise prescription builds to 30-45 minutes a day of aerobic activity, 2-3 days per week.  Home exercise guidelines will be given to patient during program as part of exercise prescription that the participant will acknowledge.  Activity Barriers & Risk Stratification:  Activity Barriers & Cardiac Risk Stratification - 09/10/20 1348      Activity Barriers & Cardiac Risk Stratification   Activity Barriers Arthritis;Joint Problems;Deconditioning;Shortness of Breath    Cardiac Risk Stratification Low           6 Minute Walk:  6 Minute Walk    Row Name 09/10/20 1511         6 Minute Walk   Phase Initial     Distance 1100 feet     Walk Time 6 minutes     # of Rest Breaks 0     MPH 2.08     METS 1.99     RPE 12     Perceived Dyspnea  13     VO2 Peak 6.98     Symptoms No     Resting HR 65 bpm     Resting BP 120/74     Resting Oxygen Saturation  96 %     Exercise Oxygen Saturation  during 6 min walk 90 %     Max Ex. HR 85 bpm     Max Ex. BP 144/74     2 Minute Post BP 136/74           Interval HR   1 Minute HR 72     2 Minute HR 77     3 Minute HR 84     4 Minute HR 85     5 Minute HR 81     6 Minute HR 83     2 Minute Post HR 65     Interval Heart Rate? Yes           Interval Oxygen   Interval Oxygen? Yes     Baseline Oxygen Saturation % 96 %     1  Minute Oxygen  Saturation % 93 %     1 Minute Liters of Oxygen 0 L     2 Minute Oxygen Saturation % 91 %     2 Minute Liters of Oxygen 0 L     3 Minute Oxygen Saturation % 90 %     3 Minute Liters of Oxygen 0 L     4 Minute Oxygen Saturation % 90 %     4 Minute Liters of Oxygen 0 L     5 Minute Oxygen Saturation % 91 %     5 Minute Liters of Oxygen 0 L     6 Minute Oxygen Saturation % 91 %     6 Minute Liters of Oxygen 0 L     2 Minute Post Oxygen Saturation % 96 %     2 Minute Post Liters of Oxygen 0 L            Oxygen Initial Assessment:  Oxygen Initial Assessment - 09/10/20 1510      Initial 6 min Walk   Oxygen Used None      Program Oxygen Prescription   Program Oxygen Prescription None      Intervention   Short Term Goals To learn and exhibit compliance with exercise, home and travel O2 prescription;To learn and understand importance of monitoring SPO2 with pulse oximeter and demonstrate accurate use of the pulse oximeter.;To learn and understand importance of maintaining oxygen saturations>88%;To learn and demonstrate proper pursed lip breathing techniques or other breathing techniques.;To learn and demonstrate proper use of respiratory medications    Long  Term Goals Exhibits compliance with exercise, home and travel O2 prescription;Verbalizes importance of monitoring SPO2 with pulse oximeter and return demonstration;Maintenance of O2 saturations>88%;Exhibits proper breathing techniques, such as pursed lip breathing or other method taught during program session;Compliance with respiratory medication;Demonstrates proper use of MDI's           Oxygen Re-Evaluation:  Oxygen Re-Evaluation    Row Name 10/05/20 1600 11/02/20 1623 12/01/20 1342         Program Oxygen Prescription   Program Oxygen Prescription None None None           Home Oxygen   Home Oxygen Device E-Tanks;Home Concentrator E-Tanks;Home Concentrator E-Tanks;Home Concentrator     Sleep Oxygen  Prescription None None None     Home Exercise Oxygen Prescription Continuous Continuous Continuous     Liters per minute _0 Home Resting Oxygen Prescription Continuous Continuous Continuous     Liters per minute _1 Compliance with Home Oxygen Use Yes Yes Yes           Goals/Expected Outcomes   Short Term Goals To learn and exhibit compliance with exercise, home and travel O2 prescription;To learn and understand importance of monitoring SPO2 with pulse oximeter and demonstrate accurate use of the pulse oximeter.;To learn and understand importance of maintaining oxygen saturations>88%;To learn and demonstrate proper pursed lip breathing techniques or other breathing techniques.;To learn and demonstrate proper use of respiratory medications To learn and exhibit compliance with exercise, home and travel O2 prescription;To learn and understand importance of monitoring SPO2 with pulse oximeter and demonstrate accurate use of the pulse oximeter.;To learn and understand importance of maintaining oxygen saturations>88%;To learn and demonstrate proper pursed lip breathing techniques or other breathing techniques.;To learn and demonstrate proper use of respiratory medications To learn and exhibit compliance with exercise, home and travel O2 prescription;To learn and understand importance  of monitoring SPO2 with pulse oximeter and demonstrate accurate use of the pulse oximeter.;To learn and understand importance of maintaining oxygen saturations>88%;To learn and demonstrate proper pursed lip breathing techniques or other breathing techniques.;To learn and demonstrate proper use of respiratory medications     Long  Term Goals Exhibits compliance with exercise, home and travel O2 prescription;Verbalizes importance of monitoring SPO2 with pulse oximeter and return demonstration;Maintenance of O2 saturations>88%;Exhibits proper breathing techniques, such as pursed lip breathing or other method taught  during program session;Compliance with respiratory medication;Demonstrates proper use of MDI's Exhibits compliance with exercise, home and travel O2 prescription;Verbalizes importance of monitoring SPO2 with pulse oximeter and return demonstration;Maintenance of O2 saturations>88%;Exhibits proper breathing techniques, such as pursed lip breathing or other method taught during program session;Compliance with respiratory medication;Demonstrates proper use of MDI's Exhibits compliance with exercise, home and travel O2 prescription;Verbalizes importance of monitoring SPO2 with pulse oximeter and return demonstration;Maintenance of O2 saturations>88%;Exhibits proper breathing techniques, such as pursed lip breathing or other method taught during program session;Compliance with respiratory medication;Demonstrates proper use of MDI's     Goals/Expected Outcomes -- -- Complaiance            Oxygen Discharge (Final Oxygen Re-Evaluation):  Oxygen Re-Evaluation - 12/01/20 1342      Program Oxygen Prescription   Program Oxygen Prescription None      Home Oxygen   Home Oxygen Device E-Tanks;Home Concentrator    Sleep Oxygen Prescription None    Home Exercise Oxygen Prescription Continuous    Liters per minute 2    Home Resting Oxygen Prescription Continuous    Liters per minute 2    Compliance with Home Oxygen Use Yes      Goals/Expected Outcomes   Short Term Goals To learn and exhibit compliance with exercise, home and travel O2 prescription;To learn and understand importance of monitoring SPO2 with pulse oximeter and demonstrate accurate use of the pulse oximeter.;To learn and understand importance of maintaining oxygen saturations>88%;To learn and demonstrate proper pursed lip breathing techniques or other breathing techniques.;To learn and demonstrate proper use of respiratory medications    Long  Term Goals Exhibits compliance with exercise, home and travel O2 prescription;Verbalizes importance  of monitoring SPO2 with pulse oximeter and return demonstration;Maintenance of O2 saturations>88%;Exhibits proper breathing techniques, such as pursed lip breathing or other method taught during program session;Compliance with respiratory medication;Demonstrates proper use of MDI's    Goals/Expected Outcomes Complaiance           Initial Exercise Prescription:  Initial Exercise Prescription - 09/10/20 1500      Date of Initial Exercise RX and Referring Provider   Date 09/10/20    Referring Provider Dr. Gwenette Greet    Expected Discharge Date 01/13/21      NuStep   Level 1    SPM 60    Minutes 22      Arm Ergometer   Level 1    RPM 45    Minutes 17      Prescription Details   Frequency (times per week) 2    Duration Progress to 30 minutes of continuous aerobic without signs/symptoms of physical distress      Intensity   THRR 40-80% of Max Heartrate 59-118    Ratings of Perceived Exertion 11-13    Perceived Dyspnea 0-4      Resistance Training   Training Prescription Yes    Weight 3 lbs    Reps 10-15           Perform  Capillary Blood Glucose checks as needed.  Exercise Prescription Changes:  Exercise Prescription Changes    Row Name 09/21/20 0748 10/05/20 1600 10/07/20 1600 10/12/20 1621 11/02/20 1600     Response to Exercise   Blood Pressure (Admit) 126/74 124/66 -- 132/64 122/60   Blood Pressure (Exercise) 144/82 130/72 -- 122/70 164/72   Blood Pressure (Exit) 108/74 140/76 -- 128/60 120/70   Heart Rate (Admit) 73 bpm 86 bpm -- 82 bpm 81 bpm   Heart Rate (Exercise) 85 bpm 90 bpm -- 103 bpm 90 bpm   Heart Rate (Exit) 71 bpm 82 bpm -- 80 bpm 82 bpm   Oxygen Saturation (Admit) 96 % 95 % -- 94 % 95 %   Oxygen Saturation (Exercise) 94 % 94 % -- 95 % 94 %   Oxygen Saturation (Exit) 96 % 98 % -- 95 % 96 %   Rating of Perceived Exertion (Exercise) 12 11 -- 13 9   Perceived Dyspnea (Exercise) 12 11 -- 13 9   Duration Continue with 30 min of aerobic exercise without  signs/symptoms of physical distress. Continue with 30 min of aerobic exercise without signs/symptoms of physical distress. -- Continue with 30 min of aerobic exercise without signs/symptoms of physical distress. Continue with 30 min of aerobic exercise without signs/symptoms of physical distress.   Intensity THRR unchanged THRR unchanged -- THRR unchanged THRR unchanged     Progression   Progression Continue to progress workloads to maintain intensity without signs/symptoms of physical distress. Continue to progress workloads to maintain intensity without signs/symptoms of physical distress. -- Continue to progress workloads to maintain intensity without signs/symptoms of physical distress. Continue to progress workloads to maintain intensity without signs/symptoms of physical distress.     Resistance Training   Training Prescription Yes Yes -- Yes Yes   Weight 3 lbs 4 lbs -- 4 lbs 4 lbs   Reps 10-15 10-15 -- 10-15 10-15   Time 10 Minutes 10 Minutes -- 10 Minutes 10 Minutes     NuStep   Level 1 1 -- 1 1   SPM 68 61 -- 68 77   Minutes 22 22 -- 22 22   METs 1.9 1.8 -- 1.9 2.1     Arm Ergometer   Level 1 1 -- 1.5 2   RPM 44 41 -- 49 69   Minutes 17 17 -- 17 17   METs 1.5 1.5 -- 1.7 2.2     Home Exercise Plan   Plans to continue exercise at -- -- Home (comment) -- --   Frequency -- -- Add 2 additional days to program exercise sessions. -- --   Initial Home Exercises Provided -- -- 10/07/20 -- --   [place] Name 11/16/20 1500 11/23/20 1338           Response to Exercise   Blood Pressure (Admit) 128/64 128/70      Blood Pressure (Exercise) 132/70 144/78      Blood Pressure (Exit) 110/62 122/64      Heart Rate (Admit) 83 bpm 74 bpm      Heart Rate (Exercise) 84 bpm 82 bpm      Heart Rate (Exit) 75 bpm 71 bpm      Oxygen Saturation (Admit) 93 % 94 %      Oxygen Saturation (Exercise) 95 % 94 %      Oxygen Saturation (Exit) 94 % 95 %      Rating of Perceived Exertion (Exercise) 10 12  Perceived Dyspnea (Exercise) 10 12      Duration Continue with 30 min of aerobic exercise without signs/symptoms of physical distress. Continue with 30 min of aerobic exercise without signs/symptoms of physical distress.      Intensity THRR unchanged THRR unchanged             Progression   Progression Continue to progress workloads to maintain intensity without signs/symptoms of physical distress. Continue to progress workloads to maintain intensity without signs/symptoms of physical distress.             Resistance Training   Training Prescription Yes Yes      Weight 4 lbs 4 lbs      Reps 10-15 10-15      Time 10 Minutes 10 Minutes             NuStep   Level 2 2      SPM 85 74      Minutes 22 22      METs 2.1 1.9             Arm Ergometer   Level 2 2      RPM 48 43      Minutes 17 17      METs 1.8 1.6             Exercise Comments:  Exercise Comments    Row Name 10/07/20 1602           Exercise Comments Reviewed home exercise program with patient today. He is not currently exercising at home. He is going to start walking around his block 2 days per week in addition to rehab.              Exercise Goals and Review:  Exercise Goals    Row Name 09/10/20 1514 10/05/20 1600 11/02/20 1622 12/01/20 1339       Exercise Goals   Increase Physical Activity Yes Yes Yes Yes    Intervention Provide advice, education, support and counseling about physical activity/exercise needs.;Develop an individualized exercise prescription for aerobic and resistive training based on initial evaluation findings, risk stratification, comorbidities and participant's personal goals. Provide advice, education, support and counseling about physical activity/exercise needs.;Develop an individualized exercise prescription for aerobic and resistive training based on initial evaluation findings, risk stratification, comorbidities and participant's personal goals. Provide advice, education,  support and counseling about physical activity/exercise needs.;Develop an individualized exercise prescription for aerobic and resistive training based on initial evaluation findings, risk stratification, comorbidities and participant's personal goals. Provide advice, education, support and counseling about physical activity/exercise needs.;Develop an individualized exercise prescription for aerobic and resistive training based on initial evaluation findings, risk stratification, comorbidities and participant's personal goals.    Expected Outcomes Short Term: Attend rehab on a regular basis to increase amount of physical activity.;Long Term: Add in home exercise to make exercise part of routine and to increase amount of physical activity.;Long Term: Exercising regularly at least 3-5 days a week. Short Term: Attend rehab on a regular basis to increase amount of physical activity.;Long Term: Add in home exercise to make exercise part of routine and to increase amount of physical activity.;Long Term: Exercising regularly at least 3-5 days a week. Short Term: Attend rehab on a regular basis to increase amount of physical activity.;Long Term: Add in home exercise to make exercise part of routine and to increase amount of physical activity.;Long Term: Exercising regularly at least 3-5 days a week. Short Term: Attend rehab on a regular basis  to increase amount of physical activity.;Long Term: Add in home exercise to make exercise part of routine and to increase amount of physical activity.;Long Term: Exercising regularly at least 3-5 days a week.    Increase Strength and Stamina Yes Yes Yes Yes    Intervention Provide advice, education, support and counseling about physical activity/exercise needs.;Develop an individualized exercise prescription for aerobic and resistive training based on initial evaluation findings, risk stratification, comorbidities and participant's personal goals. Provide advice, education, support  and counseling about physical activity/exercise needs.;Develop an individualized exercise prescription for aerobic and resistive training based on initial evaluation findings, risk stratification, comorbidities and participant's personal goals. Provide advice, education, support and counseling about physical activity/exercise needs.;Develop an individualized exercise prescription for aerobic and resistive training based on initial evaluation findings, risk stratification, comorbidities and participant's personal goals. Provide advice, education, support and counseling about physical activity/exercise needs.;Develop an individualized exercise prescription for aerobic and resistive training based on initial evaluation findings, risk stratification, comorbidities and participant's personal goals.    Expected Outcomes Short Term: Increase workloads from initial exercise prescription for resistance, speed, and METs.;Short Term: Perform resistance training exercises routinely during rehab and add in resistance training at home;Long Term: Improve cardiorespiratory fitness, muscular endurance and strength as measured by increased METs and functional capacity (6MWT) Short Term: Increase workloads from initial exercise prescription for resistance, speed, and METs.;Short Term: Perform resistance training exercises routinely during rehab and add in resistance training at home;Long Term: Improve cardiorespiratory fitness, muscular endurance and strength as measured by increased METs and functional capacity (6MWT) Short Term: Increase workloads from initial exercise prescription for resistance, speed, and METs.;Short Term: Perform resistance training exercises routinely during rehab and add in resistance training at home;Long Term: Improve cardiorespiratory fitness, muscular endurance and strength as measured by increased METs and functional capacity (6MWT) Short Term: Increase workloads from initial exercise prescription for  resistance, speed, and METs.;Short Term: Perform resistance training exercises routinely during rehab and add in resistance training at home;Long Term: Improve cardiorespiratory fitness, muscular endurance and strength as measured by increased METs and functional capacity (6MWT)    Able to understand and use rate of perceived exertion (RPE) scale Yes Yes Yes Yes    Intervention Provide education and explanation on how to use RPE scale Provide education and explanation on how to use RPE scale Provide education and explanation on how to use RPE scale Provide education and explanation on how to use RPE scale    Expected Outcomes Short Term: Able to use RPE daily in rehab to express subjective intensity level;Long Term:  Able to use RPE to guide intensity level when exercising independently Short Term: Able to use RPE daily in rehab to express subjective intensity level;Long Term:  Able to use RPE to guide intensity level when exercising independently Short Term: Able to use RPE daily in rehab to express subjective intensity level;Long Term:  Able to use RPE to guide intensity level when exercising independently Short Term: Able to use RPE daily in rehab to express subjective intensity level;Long Term:  Able to use RPE to guide intensity level when exercising independently    Able to understand and use Dyspnea scale Yes Yes Yes Yes    Intervention Provide education and explanation on how to use Dyspnea scale Provide education and explanation on how to use Dyspnea scale Provide education and explanation on how to use Dyspnea scale Provide education and explanation on how to use Dyspnea scale    Expected Outcomes Short Term: Able  to use Dyspnea scale daily in rehab to express subjective sense of shortness of breath during exertion;Long Term: Able to use Dyspnea scale to guide intensity level when exercising independently Short Term: Able to use Dyspnea scale daily in rehab to express subjective sense of shortness of  breath during exertion;Long Term: Able to use Dyspnea scale to guide intensity level when exercising independently Short Term: Able to use Dyspnea scale daily in rehab to express subjective sense of shortness of breath during exertion;Long Term: Able to use Dyspnea scale to guide intensity level when exercising independently Short Term: Able to use Dyspnea scale daily in rehab to express subjective sense of shortness of breath during exertion;Long Term: Able to use Dyspnea scale to guide intensity level when exercising independently    Knowledge and understanding of Target Heart Rate Range (THRR) Yes Yes Yes Yes    Intervention Provide education and explanation of THRR including how the numbers were predicted and where they are located for reference Provide education and explanation of THRR including how the numbers were predicted and where they are located for reference Provide education and explanation of THRR including how the numbers were predicted and where they are located for reference Provide education and explanation of THRR including how the numbers were predicted and where they are located for reference    Expected Outcomes Short Term: Able to state/look up THRR;Long Term: Able to use THRR to govern intensity when exercising independently;Short Term: Able to use daily as guideline for intensity in rehab Short Term: Able to state/look up THRR;Long Term: Able to use THRR to govern intensity when exercising independently;Short Term: Able to use daily as guideline for intensity in rehab Short Term: Able to state/look up THRR;Long Term: Able to use THRR to govern intensity when exercising independently;Short Term: Able to use daily as guideline for intensity in rehab Short Term: Able to state/look up THRR;Long Term: Able to use THRR to govern intensity when exercising independently;Short Term: Able to use daily as guideline for intensity in rehab    Understanding of Exercise Prescription Yes Yes Yes Yes     Intervention Provide education, explanation, and written materials on patient's individual exercise prescription Provide education, explanation, and written materials on patient's individual exercise prescription Provide education, explanation, and written materials on patient's individual exercise prescription Provide education, explanation, and written materials on patient's individual exercise prescription    Expected Outcomes Short Term: Able to explain program exercise prescription;Long Term: Able to explain home exercise prescription to exercise independently Short Term: Able to explain program exercise prescription;Long Term: Able to explain home exercise prescription to exercise independently Short Term: Able to explain program exercise prescription;Long Term: Able to explain home exercise prescription to exercise independently Short Term: Able to explain program exercise prescription;Long Term: Able to explain home exercise prescription to exercise independently           Exercise Goals Re-Evaluation :  Exercise Goals Re-Evaluation    Row Name 10/05/20 1600 11/02/20 1600 12/01/20 1340         Exercise Goal Re-Evaluation   Exercise Goals Review Increase Physical Activity;Increase Strength and Stamina;Able to understand and use rate of perceived exertion (RPE) scale;Able to understand and use Dyspnea scale;Knowledge and understanding of Target Heart Rate Range (THRR);Able to check pulse independently;Understanding of Exercise Prescription Increase Physical Activity;Increase Strength and Stamina;Able to understand and use rate of perceived exertion (RPE) scale;Able to understand and use Dyspnea scale;Knowledge and understanding of Target Heart Rate Range (THRR);Able to check pulse independently;Understanding  of Exercise Prescription Increase Physical Activity;Increase Strength and Stamina;Able to understand and use rate of perceived exertion (RPE) scale;Able to understand and use Dyspnea  scale;Knowledge and understanding of Target Heart Rate Range (THRR);Able to check pulse independently;Understanding of Exercise Prescription     Comments Patient has completed 5 exercise sessions. He has tolerated exercise well. He uses his rescue inhaler most days before the second station. He has not increased intensities with aerobic training since he started the program. He still takes some breaks and gets short of breath. He is currently working at 1.8 METs on the NuStep. Will continue to monitor and progress as able. Patient has completed 9 exercise sessions. He has had inconsistent attendance and is progressing slowly. He is slowly increasing intensities and his MET level is increasing as well. He has reported that he feels stronger since starting the program. He says that walking is easier with decreased shortness of breath at home. He is currently exercising at 2.1 METs on the NuStep. Will continue to monitor and progress as able. patient has completed 14 exercise sessions. He has had inconsitent attendance throughout rehab. He is progressing slowly. He reports that rehab is helping him at home. He is trying to exercise at home when he has time. He has noticed a difference in his shortness of breath while doing activities. He is currenlty exercising at 1.9 METs on the NuStep. Will continue to monitor and progress as able.     Expected Outcomes Through exercise at rehab and with a home exercise program, patient will achieve their goals. Through exercise at rehab and with a home exercise program, patient will achieve their goals. Through exercise at rehab and with a home exercise program, patient will achieve their goals.            Discharge Exercise Prescription (Final Exercise Prescription Changes):  Exercise Prescription Changes - 11/23/20 1338      Response to Exercise   Blood Pressure (Admit) 128/70    Blood Pressure (Exercise) 144/78    Blood Pressure (Exit) 122/64    Heart Rate (Admit)  74 bpm    Heart Rate (Exercise) 82 bpm    Heart Rate (Exit) 71 bpm    Oxygen Saturation (Admit) 94 %    Oxygen Saturation (Exercise) 94 %    Oxygen Saturation (Exit) 95 %    Rating of Perceived Exertion (Exercise) 12    Perceived Dyspnea (Exercise) 12    Duration Continue with 30 min of aerobic exercise without signs/symptoms of physical distress.    Intensity THRR unchanged      Progression   Progression Continue to progress workloads to maintain intensity without signs/symptoms of physical distress.      Resistance Training   Training Prescription Yes    Weight 4 lbs    Reps 10-15    Time 10 Minutes      NuStep   Level 2    SPM 74    Minutes 22    METs 1.9      Arm Ergometer   Level 2    RPM 43    Minutes 17    METs 1.6           Nutrition:  Target Goals: Understanding of nutrition guidelines, daily intake of sodium <1555m, cholesterol <2030m calories 30% from fat and 7% or less from saturated fats, daily to have 5 or more servings of fruits and vegetables.  Biometrics:  Pre Biometrics - 11/23/20 1342      Pre  Biometrics   Weight 110.2 kg            Nutrition Therapy Plan and Nutrition Goals:  Nutrition Therapy & Goals - 09/29/20 1451      Personal Nutrition Goals   Comments Patient scored 85 on his diet assessment. We provide 2 educational sessions with handouts on heart healthy nutrition and offer RD referral.      Intervention Plan   Intervention Nutrition handout(s) given to patient.           Nutrition Assessments:  Nutrition Assessments - 09/10/20 1353      MEDFICTS Scores   Pre Score 85          MEDIFICTS Score Key:  ?70 Need to make dietary changes   40-70 Heart Healthy Diet  ? 40 Therapeutic Level Cholesterol Diet   Picture Your Plate Scores:  <17 Unhealthy dietary pattern with much room for improvement.  41-50 Dietary pattern unlikely to meet recommendations for good health and room for improvement.  51-60 More  healthful dietary pattern, with some room for improvement.   >60 Healthy dietary pattern, although there may be some specific behaviors that could be improved.    Nutrition Goals Re-Evaluation:   Nutrition Goals Discharge (Final Nutrition Goals Re-Evaluation):   Psychosocial: Target Goals: Acknowledge presence or absence of significant depression and/or stress, maximize coping skills, provide positive support system. Participant is able to verbalize types and ability to use techniques and skills needed for reducing stress and depression.  Initial Review & Psychosocial Screening:  Initial Psych Review & Screening - 09/10/20 1353      Initial Review   Current issues with None Identified      Family Dynamics   Good Support System? Yes    Comments He has a couple of friends who he grew up with and was in the [employer] with that have always been there for him. He has a sister who he speaks with frequently and lives close by. Overall he feels like he has a good support system.      Barriers   Psychosocial barriers to participate in program The patient should benefit from training in stress management and relaxation.      Screening Interventions   Interventions Encouraged to exercise;Provide feedback about the scores to participant    Expected Outcomes Long Term Goal: Stressors or current issues are controlled or eliminated.;Short Term goal: Identification and review with participant of any Quality of Life or Depression concerns found by scoring the questionnaire.;Long Term goal: The participant improves quality of Life and PHQ9 Scores as seen by post scores and/or verbalization of changes           Quality of Life Scores:  Quality of Life - 09/10/20 1516      Quality of Life   Select Quality of Life      Quality of Life Scores   Health/Function Pre 14.85 %    Socioeconomic Pre 20.14 %    Psych/Spiritual Pre 26.42 %    Family Pre 13.5 %    GLOBAL Pre 18.06 %          Scores of  19 and below usually indicate a poorer quality of life in these areas.  A difference of  2-3 points is a clinically meaningful difference.  A difference of 2-3 points in the total score of the Quality of Life Index has been associated with significant improvement in overall quality of life, self-image, physical symptoms, and general health in studies assessing  change in quality of life.   PHQ-9: Recent Review Flowsheet Data    Depression screen [facility] 2/9 09/10/2020   Decreased Interest 1   Down, Depressed, Hopeless 1   PHQ - 2 Score 2   Altered sleeping 1   Tired, decreased energy 1   Change in appetite 2   Feeling bad or failure about yourself  0   Trouble concentrating 1   Moving slowly or fidgety/restless 0   Suicidal thoughts 1    PHQ-9 Score 8   Difficult doing work/chores Somewhat difficult     Interpretation of Total Score  Total Score Depression Severity:  1-4 = Minimal depression, 5-9 = Mild depression, 10-14 = Moderate depression, 15-19 = Moderately severe depression, 20-27 = Severe depression   Psychosocial Evaluation and Intervention:  Psychosocial Evaluation - 09/10/20 1517      Psychosocial Evaluation & Interventions   Interventions Stress management education;Relaxation education;Encouraged to exercise with the program and follow exercise prescription    Comments Patient has no barriers in attending the pulmonary rehab program. He is unsure if he wants to do the full 18 weeks, but states that he will do at least 12. Patient lives alone, but does report that he has a good support system. He has several friends in the area which he has been friends with since he was in high school, and some of them served in [employer] with him. He also has a sister that lives near him who he speaks to regularly. His PHQ-9 was an 9, and his overall QOL was a 18.06. He states that he no longer has any hobbies, as he has gotten older, he feels like he no longer has the ability or energy to engage  in his hobbies anymore. His hobbies included working on cars and racing cars. He states that his makes him feel like he does not have a purpose at times, and wonders if it would matter if he was dead. However, he states that he does not have any suicidal thoughts and has no plans of hurting himself. He declined a referral to a counselor and states that he copes well on his own. I think he will benefit from exercise and being around other people. I encouraged him to try to find other hobbies that were not as physically demanding. His overall goals in the program are to lose some weight and to breathe better.    Expected Outcomes The patient's psychosocial issues will decrease as he becomes stronger by attending pulmonary rehab. Through stress management, relaxation management, and exercise, the patient will begin to feel his sense of purpose again, and his QOL will increase.    Continue Psychosocial Services  Follow up required by staff           Psychosocial Re-Evaluation:  Psychosocial Re-Evaluation    Leslie Name 09/29/20 1453 10/28/20 0939 11/24/20 1332         Psychosocial Re-Evaluation   Current issues with None Identified None Identified None Identified     Comments Patient is new to the program completing 3 sessions. He stated during his orientation he had feelings of having no purpose at times duet to not being able to do the things he used to do. He does not have a diagnosis of depression or history of depression but is taking Mirtazepine and wellbutrin. Will continue to monitor. Patient is new to the program completing 8 sessions. He stated during his orientation he had feelings of having no purpose at times  due to not being able to do the things he used to do. He does not have a diagnosis of depression or history of depression but is taking Mirtazepine and wellbutrin. Will continue to monitor. Patient has completed 14 sessions. He continues to have no psychosocial issues identified. He  continues to take Mirtazepine and wellbutrin. He is quite but does seem to enjoy coming to class. His attendance has improved during this review period.  Will continue to monitor.     Expected Outcomes Patient will continue to have no psychosocial issues identififed. Patient will continue to have no psychosocial issues identififed. Patient will continue to have no psychosocial issues identififed.     Interventions Stress management education;Encouraged to attend Pulmonary Rehabilitation for the exercise;Relaxation education Stress management education;Encouraged to attend Pulmonary Rehabilitation for the exercise;Relaxation education Stress management education;Encouraged to attend Pulmonary Rehabilitation for the exercise;Relaxation education     Continue Psychosocial Services  No Follow up required No Follow up required No Follow up required            Psychosocial Discharge (Final Psychosocial Re-Evaluation):  Psychosocial Re-Evaluation - 11/24/20 1332      Psychosocial Re-Evaluation   Current issues with None Identified    Comments Patient has completed 14 sessions. He continues to have no psychosocial issues identified. He continues to take Mirtazepine and wellbutrin. He is quite but does seem to enjoy coming to class. His attendance has improved during this review period.  Will continue to monitor.    Expected Outcomes Patient will continue to have no psychosocial issues identififed.    Interventions Stress management education;Encouraged to attend Pulmonary Rehabilitation for the exercise;Relaxation education    Continue Psychosocial Services  No Follow up required            Education: Education Goals: Education classes will be provided on a weekly basis, covering required topics. Participant will state understanding/return demonstration of topics presented.  Learning Barriers/Preferences:  Learning Barriers/Preferences - 09/10/20 1355      Learning Barriers/Preferences    Learning Barriers None    Learning Preferences Skilled Demonstration;Pictoral;Written Material           Education Topics: How Lungs Work and Diseases: - Discuss the anatomy of the lungs and diseases that can affect the lungs, such as COPD.   Exercise: -Discuss the importance of exercise, FITT principles of exercise, normal and abnormal responses to exercise, and how to exercise safely.   Environmental Irritants: -Discuss types of environmental irritants and how to limit exposure to environmental irritants.   Meds/Inhalers and oxygen: - Discuss respiratory medications, definition of an inhaler and oxygen, and the proper way to use an inhaler and oxygen. Flowsheet Row PULMONARY REHAB CHRONIC OBSTRUCTIVE PULMONARY DISEASE from 11/18/2020 in [place]  Date 10/21/20  Educator KM      Energy Saving Techniques: - Discuss methods to conserve energy and decrease shortness of breath when performing activities of daily living.    Bronchial Hygiene / Breathing Techniques: - Discuss breathing mechanics, pursed-lip breathing technique,  proper posture, effective ways to clear airways, and other functional breathing techniques   Cleaning Equipment: - Provides group verbal and written instruction about the health risks of elevated stress, cause of high stress, and healthy ways to reduce stress. Flowsheet Row PULMONARY REHAB CHRONIC OBSTRUCTIVE PULMONARY DISEASE from 11/18/2020 in [place]  Date 11/11/20  Educator Handout  Instruction Review Code 1- Verbalizes Understanding      Nutrition I: Fats: - Discuss the types  of cholesterol, what cholesterol does to the body, and how cholesterol levels can be controlled. Flowsheet Row PULMONARY REHAB CHRONIC OBSTRUCTIVE PULMONARY DISEASE from 11/18/2020 in [place]  Date 11/18/20  Educator [facility]  Instruction Review Code 1- Verbalizes Understanding      Nutrition II:  Labels: -Discuss the different components of food labels and how to read food labels.   Respiratory Infections: - Discuss the signs and symptoms of respiratory infections, ways to prevent respiratory infections, and the importance of seeking medical treatment when having a respiratory infection.   Stress I: Signs and Symptoms: - Discuss the causes of stress, how stress may lead to anxiety and depression, and ways to limit stress.   Stress II: Relaxation: -Discuss relaxation techniques to limit stress.   Oxygen for Home/Travel: - Discuss how to prepare for travel when on oxygen and proper ways to transport and store oxygen to ensure safety. Flowsheet Row PULMONARY REHAB CHRONIC OBSTRUCTIVE PULMONARY DISEASE from 11/18/2020 in [place]  Date 09/23/20  Educator DJ  Instruction Review Code 1- Verbalizes Understanding      Knowledge Questionnaire Score:  Knowledge Questionnaire Score - 09/10/20 1405      Knowledge Questionnaire Score   Pre Score 16/18           Core Components/Risk Factors/Patient Goals at Admission:  Personal Goals and Risk Factors at Admission - 09/10/20 1356      Core Components/Risk Factors/Patient Goals on Admission    Weight Management Yes;Weight Loss    Intervention Weight Management: Develop a combined nutrition and exercise program designed to reach desired caloric intake, while maintaining appropriate intake of nutrient and fiber, sodium and fats, and appropriate energy expenditure required for the weight goal.;Weight Management: Provide education and appropriate resources to help participant work on and attain dietary goals.;Weight Management/Obesity: Establish reasonable short term and long term weight goals.;Obesity: Provide education and appropriate resources to help participant work on and attain dietary goals.    Expected Outcomes Short Term: Continue to assess and modify interventions until short term weight is  achieved;Long Term: Adherence to nutrition and physical activity/exercise program aimed toward attainment of established weight goal;Weight Loss: Understanding of general recommendations for a balanced deficit meal plan, which promotes 1-2 lb weight loss per week and includes a negative energy balance of [phone] kcal/d;Understanding of distribution of calorie intake throughout the day with the consumption of 4-5 meals/snacks;Understanding recommendations for meals to include 15-35% energy as protein, 25-35% energy from fat, 35-60% energy from carbohydrates, less than 2110m of dietary cholesterol, 20-35 gm of total fiber daily    Improve shortness of breath with ADL's Yes    Intervention Provide education, individualized exercise plan and daily activity instruction to help decrease symptoms of SOB with activities of daily living.    Expected Outcomes Short Term: Improve cardiorespiratory fitness to achieve a reduction of symptoms when performing ADLs;Long Term: Be able to perform more ADLs without symptoms or delay the onset of symptoms           Core Components/Risk Factors/Patient Goals Review:   Goals and Risk Factor Review    Row Name 09/29/20 1500 10/28/20 0940 11/24/20 1334         Core Components/Risk Factors/Patient Goals Review   Personal Goals Review Improve shortness of breath with ADL's;Other Improve shortness of breath with ADL's;Other Improve shortness of breath with ADL's;Other     Review Patient is new to the program completing 3 sessions. He was referred by the [facility]  with COPD. His personal goals for the program are to get stronger and improve his breathing. We will continue to monitor his progress as he works toward meeting these goals. Patient has completed 8 sessions gaining 10 lbs since his initial visit. He is doing well in the program. His attendance okay. He has missed the last 2 sessions. He has been progressed. His blood pressure is usually at goal below 130/80. His O2  saturations usually remain above 94% on RA. His personal goals for the program are to get stronger and improve his breathing. We will continue to monitor his progress as he works toward meeting these goals. Patient has completed 14 sessions maintaining his weight since last 30 day review. He is doing well in the program with more consistent attendance and progressions. His blood pressure remaines WNL. His O2 saturations are averaging 94-95% during exercise on RA. His personal goals for the program are to get stronger and improve his breathing. He states he feels he is getting stronger. We will continue to monitor his progress as he works towards meeting these goals.     Expected Outcomes Patient will complete the program meeting both program and personal goals. Patient will complete the program meeting both program and personal goals. Patient will complete the program meeting both program and personal goals.            Core Components/Risk Factors/Patient Goals at Discharge (Final Review):   Goals and Risk Factor Review - 11/24/20 1334      Core Components/Risk Factors/Patient Goals Review   Personal Goals Review Improve shortness of breath with ADL's;Other    Review Patient has completed 14 sessions maintaining his weight since last 30 day review. He is doing well in the program with more consistent attendance and progressions. His blood pressure remaines WNL. His O2 saturations are averaging 94-95% during exercise on RA. His personal goals for the program are to get stronger and improve his breathing. He states he feels he is getting stronger. We will continue to monitor his progress as he works towards meeting these goals.    Expected Outcomes Patient will complete the program meeting both program and personal goals.           ITP Comments:   Comments: ITP REVIEW Pt is making expected progress toward pulmonary rehab goals after completing 15 sessions. Recommend continued exercise, life  style modification, education, and utilization of breathing techniques to increase stamina and strength and decrease shortness of breath with exertion.

## 2020-12-05 DIAGNOSIS — J449 Chronic obstructive pulmonary disease, unspecified: Secondary | ICD-10-CM | POA: Diagnosis not present

## 2020-12-05 DIAGNOSIS — J9601 Acute respiratory failure with hypoxia: Secondary | ICD-10-CM | POA: Diagnosis not present

## 2020-12-07 ENCOUNTER — Encounter (HOSPITAL_COMMUNITY): Payer: No Typology Code available for payment source

## 2020-12-09 ENCOUNTER — Other Ambulatory Visit: Payer: Self-pay

## 2020-12-09 ENCOUNTER — Encounter (HOSPITAL_COMMUNITY)
Admission: RE | Admit: 2020-12-09 | Discharge: 2020-12-09 | Disposition: A | Discharge status: Home / Self Care | Payer: No Typology Code available for payment source | Source: Ambulatory Visit | Attending: Pulmonary Disease | Admitting: Pulmonary Disease

## 2020-12-09 DIAGNOSIS — J449 Chronic obstructive pulmonary disease, unspecified: Secondary | ICD-10-CM | POA: Insufficient documentation

## 2020-12-09 NOTE — Progress Notes (Signed)
Daily Session Note  Patient Details  Name: Wesley Daugherty MRN: [id number] Date of Birth: [birth year] Referring Provider:   Flowsheet Row PULMONARY REHAB COPD ORIENTATION from 09/10/2020 in [place]  Referring Provider Dr. Gwenette Greet       Encounter Date: 12/09/2020  Check In:  Session Check In - 12/09/20 1500       Check-In   Supervising physician immediately available to respond to emergencies [facility] MD immediately available    Physician(s) Dr.. Domenic Polite    Location [facility]    Staff Present Cathren Harsh, MS, Exercise Physiologist    Virtual Visit No    Medication changes reported     No    Fall or balance concerns reported    No    Tobacco Cessation No Change    Warm-up and Cool-down Performed as group-led instruction    Resistance Training Performed Yes    VAD Patient? No    PAD/SET Patient? No      Pain Assessment   Currently in Pain? No/denies    Pain Score 0-No pain    Multiple Pain Sites No             Capillary Blood Glucose: No results found for this or any previous visit (from the past 24 hour(s)).    Social History   Tobacco Use  Smoking Status Former   Packs/day: 0.50   Years: 15.00   Pack years: 7.50   Types: Cigarettes   Start date: 72   Quit date: 1998   Years since quitting: 24.4  Smokeless Tobacco Never    Goals Met:  Proper associated with RPD/PD & O2 Sat Independence with exercise equipment Using PLB without cueing & demonstrates good technique Exercise tolerated well No report of cardiac concerns or symptoms Strength training completed today  Goals Unmet:  Not Applicable  Comments: check out 1600   Dr. Kathie Dike is Medical Director for [facility] Pulmonary [facility].

## 2020-12-14 ENCOUNTER — Other Ambulatory Visit: Payer: Self-pay

## 2020-12-14 ENCOUNTER — Encounter (HOSPITAL_COMMUNITY)
Admission: RE | Admit: 2020-12-14 | Discharge: 2020-12-14 | Disposition: A | Discharge status: Home / Self Care | Payer: No Typology Code available for payment source | Source: Ambulatory Visit | Attending: Pulmonary Disease | Admitting: Pulmonary Disease

## 2020-12-14 DIAGNOSIS — J449 Chronic obstructive pulmonary disease, unspecified: Secondary | ICD-10-CM | POA: Diagnosis not present

## 2020-12-14 NOTE — Progress Notes (Signed)
Daily Session Note  Patient Details  Name: Wesley Daugherty MRN: [id number] Date of Birth: [birth year] Referring Provider:   Flowsheet Row PULMONARY REHAB COPD ORIENTATION from 09/10/2020 in [place]  Referring Provider Dr. Gwenette Greet       Encounter Date: 12/14/2020  Check In:  Session Check In - 12/14/20 1500       Check-In   Supervising physician immediately available to respond to emergencies [facility] MD immediately available    Physician(s) Dr. Harl Bowie    Location [facility]    Staff Present Hoy Register, MS, ACSM-CEP, Exercise Physiologist    Virtual Visit No    Medication changes reported     No    Fall or balance concerns reported    No    Tobacco Cessation No Change    Warm-up and Cool-down Performed as group-led instruction    Resistance Training Performed Yes    VAD Patient? No    PAD/SET Patient? No      Pain Assessment   Currently in Pain? No/denies    Pain Score 0-No pain    Multiple Pain Sites No             Capillary Blood Glucose: No results found for this or any previous visit (from the past 24 hour(s)).    Social History   Tobacco Use  Smoking Status Former   Packs/day: 0.50   Years: 15.00   Pack years: 7.50   Types: Cigarettes   Start date: 40   Quit date: 1998   Years since quitting: 24.4  Smokeless Tobacco Never    Goals Met:  Independence with exercise equipment Exercise tolerated well No report of cardiac concerns or symptoms Strength training completed today  Goals Unmet:  Not Applicable  Comments: checkout time is 1600   Dr. Kathie Dike is Market researcher for [facility].

## 2020-12-15 DIAGNOSIS — J9601 Acute respiratory failure with hypoxia: Secondary | ICD-10-CM | POA: Diagnosis not present

## 2020-12-15 DIAGNOSIS — J449 Chronic obstructive pulmonary disease, unspecified: Secondary | ICD-10-CM | POA: Diagnosis not present

## 2020-12-16 ENCOUNTER — Encounter (HOSPITAL_COMMUNITY)
Admission: RE | Admit: 2020-12-16 | Discharge: 2020-12-16 | Disposition: A | Discharge status: Home / Self Care | Payer: No Typology Code available for payment source | Source: Ambulatory Visit | Attending: Pulmonary Disease | Admitting: Pulmonary Disease

## 2020-12-16 ENCOUNTER — Other Ambulatory Visit: Payer: Self-pay

## 2020-12-16 DIAGNOSIS — J449 Chronic obstructive pulmonary disease, unspecified: Secondary | ICD-10-CM | POA: Diagnosis not present

## 2020-12-16 NOTE — Progress Notes (Signed)
Daily Session Note  Patient Details  Name: Wesley Daugherty MRN: [id number] Date of Birth: 09/15/1945 Referring Provider:   Flowsheet Row PULMONARY REHAB COPD ORIENTATION from 09/10/2020 in [place]  Referring Provider Dr. Gwenette Greet       Encounter Date: 12/16/2020  Check In:  Session Check In - 12/16/20 1455       Check-In   Supervising physician immediately available to respond to emergencies [facility] MD immediately available    Physician(s) Dr. Harrington Challenger    Location [facility]    Staff Present Aundra Dubin, RN, Bjorn Loser, MS, ACSM-CEP, Exercise Physiologist    Virtual Visit No    Medication changes reported     No    Fall or balance concerns reported    No    Tobacco Cessation No Change    Warm-up and Cool-down Performed as group-led instruction    Resistance Training Performed Yes    VAD Patient? No    PAD/SET Patient? No      Pain Assessment   Currently in Pain? No/denies    Pain Score 0-No pain    Multiple Pain Sites No             Capillary Blood Glucose: No results found for this or any previous visit (from the past 24 hour(s)).    Social History   Tobacco Use  Smoking Status Former   Packs/day: 0.50   Years: 15.00   Pack years: 7.50   Types: Cigarettes   Start date: 67   Quit date: 1998   Years since quitting: 24.4  Smokeless Tobacco Never    Goals Met:  Proper associated with RPD/PD & O2 Sat Independence with exercise equipment Using PLB without cueing & demonstrates good technique Exercise tolerated well No report of cardiac concerns or symptoms Strength training completed today  Goals Unmet:  Not Applicable  Comments: Check out 1600.   Dr. Kathie Dike is Medical Director for [facility].

## 2020-12-21 ENCOUNTER — Encounter (HOSPITAL_COMMUNITY): Payer: No Typology Code available for payment source

## 2020-12-22 NOTE — Progress Notes (Signed)
Pulmonary Individual Treatment Plan  Patient Details  Name: Wesley Daugherty MRN: [id number] Date of Birth: Jan 18, 1946 Referring Provider:   Flowsheet Row PULMONARY REHAB COPD ORIENTATION from 09/10/2020 in [place]  Referring Provider Dr. Gwenette Greet       Initial Encounter Date:  Flowsheet Row PULMONARY REHAB COPD ORIENTATION from 09/10/2020 in [place]  Date 09/10/20       Visit Diagnosis: Chronic obstructive pulmonary disease, unspecified COPD type ([place])  Patient's Home Medications on Admission:   Current Outpatient Medications:    albuterol (PROVENTIL HFA;VENTOLIN HFA) 108 (90 Base) MCG/ACT inhaler, Inhale 2 puffs into the lungs every 6 (six) hours as needed for wheezing or shortness of breath., Disp: , Rfl:    albuterol (PROVENTIL) (5 MG/ML) 0.5% nebulizer solution, Take 2.5 mg by nebulization every 6 (six) hours as needed for wheezing or shortness of breath., Disp: , Rfl:    amLODipine (NORVASC) 2.5 MG tablet, Take 5 mg by mouth daily., Disp: , Rfl:    atorvastatin (LIPITOR) 10 MG tablet, Take 10 mg by mouth daily., Disp: , Rfl:    budesonide-formoterol (SYMBICORT) 160-4.5 MCG/ACT inhaler, Inhale 2 puffs into the lungs 2 (two) times daily., Disp: , Rfl:    buPROPion (WELLBUTRIN XL) 150 MG 24 hr tablet, Take 150 mg by mouth daily., Disp: , Rfl:    Ergocalciferol (VITAMIN D2) 50 MCG (2000 UT) TABS, Take 1 tablet by mouth daily., Disp: , Rfl:    furosemide (LASIX) 20 MG tablet, Take 20 mg by mouth daily., Disp: , Rfl:    metoprolol tartrate (LOPRESSOR) 50 MG tablet, , Disp: , Rfl:    mirtazapine (REMERON) 15 MG tablet, Take 15 mg by mouth at bedtime., Disp: , Rfl:    naproxen (NAPROSYN) 500 MG tablet, Take 500 mg by mouth 2 (two) times daily with a meal. (Patient not taking: Reported on 09/14/2020), Disp: , Rfl:    omeprazole (PRILOSEC) 20 MG capsule, Take 20 mg by mouth daily., Disp: , Rfl:    OXcarbazepine (TRILEPTAL) 150 MG tablet,  Take 150 mg by mouth 2 (two) times daily., Disp: , Rfl:    sildenafil (VIAGRA) 100 MG tablet, Take 100 mg by mouth as needed for erectile dysfunction., Disp: , Rfl:    tiotropium (SPIRIVA) 18 MCG inhalation capsule, Place 18 mcg into inhaler and inhale daily. (Patient not taking: Reported on 09/14/2020), Disp: , Rfl:   Past Medical History: Past Medical History:  Diagnosis Date   Asthma     Tobacco Use: Social History   Tobacco Use  Smoking Status Former   Packs/day: 0.50   Years: 15.00   Pack years: 7.50   Types: Cigarettes   Start date: 67   Quit date: 1998   Years since quitting: 24.4  Smokeless Tobacco Never    Labs: Recent Review Flowsheet Data   There is no flowsheet data to display.     Capillary Blood Glucose: No results found for: GLUCAP   Pulmonary Assessment Scores:  Pulmonary Assessment Scores     Row Name 09/10/20 1349         ADL UCSD   SOB Score total 43           CAT Score     CAT Score 23           mMRC Score     mMRC Score 3            UCSD: Self-administered rating of dyspnea associated with activities of  daily living (ADLs) 6-point scale (0 = "not at all" to 5 = "maximal or unable to do because of breathlessness")  Scoring Scores range from 0 to 120.  Minimally important difference is 5 units  CAT: CAT can identify the health impairment of COPD patients and is better correlated with disease progression.  CAT has a scoring range of zero to 40. The CAT score is classified into four groups of low (less than 10), medium (10 - 20), high (21-30) and very high (31-40) based on the impact level of disease on health status. A CAT score over 10 suggests significant symptoms.  A worsening CAT score could be explained by an exacerbation, poor medication adherence, poor inhaler technique, or progression of COPD or comorbid conditions.  CAT MCID is 2 points  mMRC: mMRC (Modified Medical Research Council) Dyspnea Scale is used to assess the  degree of baseline functional disability in patients of respiratory disease due to dyspnea. No minimal important difference is established. A decrease in score of 1 point or greater is considered a positive change.   Pulmonary Function Assessment:  Pulmonary Function Assessment - 09/10/20 1342       Breath   Shortness of Breath Yes;Limiting activity             Exercise Target Goals: Exercise Program Goal: Individual exercise prescription set using results from initial 6 min walk test and THRR while considering  patient's activity barriers and safety.   Exercise Prescription Goal: Initial exercise prescription builds to 30-45 minutes a day of aerobic activity, 2-3 days per week.  Home exercise guidelines will be given to patient during program as part of exercise prescription that the participant will acknowledge.  Activity Barriers & Risk Stratification:  Activity Barriers & Cardiac Risk Stratification - 09/10/20 1348       Activity Barriers & Cardiac Risk Stratification   Activity Barriers Arthritis;Joint Problems;Deconditioning;Shortness of Breath    Cardiac Risk Stratification Low             6 Minute Walk:  6 Minute Walk     Row Name 09/10/20 1511         6 Minute Walk   Phase Initial     Distance 1100 feet     Walk Time 6 minutes     # of Rest Breaks 0     MPH 2.08     METS 1.99     RPE 12     Perceived Dyspnea  13     VO2 Peak 6.98     Symptoms No     Resting HR 65 bpm     Resting BP 120/74     Resting Oxygen Saturation  96 %     Exercise Oxygen Saturation  during 6 min walk 90 %     Max Ex. HR 85 bpm     Max Ex. BP 144/74     2 Minute Post BP 136/74           Interval HR     1 Minute HR 72     2 Minute HR 77     3 Minute HR 84     4 Minute HR 85     5 Minute HR 81     6 Minute HR 83     2 Minute Post HR 65     Interval Heart Rate? Yes           Interval Oxygen     Interval Oxygen? Yes  Baseline Oxygen Saturation % 96 %     1  Minute Oxygen Saturation % 93 %     1 Minute Liters of Oxygen 0 L     2 Minute Oxygen Saturation % 91 %     2 Minute Liters of Oxygen 0 L     3 Minute Oxygen Saturation % 90 %     3 Minute Liters of Oxygen 0 L     4 Minute Oxygen Saturation % 90 %     4 Minute Liters of Oxygen 0 L     5 Minute Oxygen Saturation % 91 %     5 Minute Liters of Oxygen 0 L     6 Minute Oxygen Saturation % 91 %     6 Minute Liters of Oxygen 0 L     2 Minute Post Oxygen Saturation % 96 %     2 Minute Post Liters of Oxygen 0 L             Oxygen Initial Assessment:  Oxygen Initial Assessment - 09/10/20 1510       Initial 6 min Walk   Oxygen Used None      Program Oxygen Prescription   Program Oxygen Prescription None      Intervention   Short Term Goals To learn and exhibit compliance with exercise, home and travel O2 prescription;To learn and understand importance of monitoring SPO2 with pulse oximeter and demonstrate accurate use of the pulse oximeter.;To learn and understand importance of maintaining oxygen saturations>88%;To learn and demonstrate proper pursed lip breathing techniques or other breathing techniques. ;To learn and demonstrate proper use of respiratory medications    Long  Term Goals Exhibits compliance with exercise, home  and travel O2 prescription;Verbalizes importance of monitoring SPO2 with pulse oximeter and return demonstration;Maintenance of O2 saturations>88%;Exhibits proper breathing techniques, such as pursed lip breathing or other method taught during program session;Compliance with respiratory medication;Demonstrates proper use of MDI's             Oxygen Re-Evaluation:  Oxygen Re-Evaluation     Row Name 10/05/20 1600 11/02/20 1623 12/01/20 1342 12/22/20 0715       Program Oxygen Prescription   Program Oxygen Prescription None None None None         Home Oxygen        Home Oxygen Device E-Tanks;Home Concentrator E-Tanks;Home Concentrator E-Tanks;Home  Concentrator E-Tanks;Home Concentrator    Sleep Oxygen Prescription None None None None    Home Exercise Oxygen Prescription Continuous Continuous Continuous None    Liters per minute _0 --    Home Resting Oxygen Prescription Continuous Continuous Continuous None    Liters per minute _1 --    Compliance with Home Oxygen Use Yes Yes Yes Yes         Goals/Expected Outcomes        Short Term Goals To learn and exhibit compliance with exercise, home and travel O2 prescription;To learn and understand importance of monitoring SPO2 with pulse oximeter and demonstrate accurate use of the pulse oximeter.;To learn and understand importance of maintaining oxygen saturations>88%;To learn and demonstrate proper pursed lip breathing techniques or other breathing techniques. ;To learn and demonstrate proper use of respiratory medications To learn and exhibit compliance with exercise, home and travel O2 prescription;To learn and understand importance of monitoring SPO2 with pulse oximeter and demonstrate accurate use of the pulse oximeter.;To learn and understand importance of maintaining oxygen saturations>88%;To learn and demonstrate proper pursed  lip breathing techniques or other breathing techniques. ;To learn and demonstrate proper use of respiratory medications To learn and exhibit compliance with exercise, home and travel O2 prescription;To learn and understand importance of monitoring SPO2 with pulse oximeter and demonstrate accurate use of the pulse oximeter.;To learn and understand importance of maintaining oxygen saturations>88%;To learn and demonstrate proper pursed lip breathing techniques or other breathing techniques. ;To learn and demonstrate proper use of respiratory medications To learn and exhibit compliance with exercise, home and travel O2 prescription;To learn and understand importance of monitoring SPO2 with pulse oximeter and demonstrate accurate use of the pulse oximeter.;To learn and  understand importance of maintaining oxygen saturations>88%;To learn and demonstrate proper pursed lip breathing techniques or other breathing techniques. ;To learn and demonstrate proper use of respiratory medications    Long  Term Goals Exhibits compliance with exercise, home  and travel O2 prescription;Verbalizes importance of monitoring SPO2 with pulse oximeter and return demonstration;Maintenance of O2 saturations>88%;Exhibits proper breathing techniques, such as pursed lip breathing or other method taught during program session;Compliance with respiratory medication;Demonstrates proper use of MDI's Exhibits compliance with exercise, home  and travel O2 prescription;Verbalizes importance of monitoring SPO2 with pulse oximeter and return demonstration;Maintenance of O2 saturations>88%;Exhibits proper breathing techniques, such as pursed lip breathing or other method taught during program session;Compliance with respiratory medication;Demonstrates proper use of MDI's Exhibits compliance with exercise, home  and travel O2 prescription;Verbalizes importance of monitoring SPO2 with pulse oximeter and return demonstration;Maintenance of O2 saturations>88%;Exhibits proper breathing techniques, such as pursed lip breathing or other method taught during program session;Compliance with respiratory medication;Demonstrates proper use of MDI's Exhibits compliance with exercise, home  and travel O2 prescription;Verbalizes importance of monitoring SPO2 with pulse oximeter and return demonstration;Maintenance of O2 saturations>88%;Exhibits proper breathing techniques, such as pursed lip breathing or other method taught during program session;Compliance with respiratory medication;Demonstrates proper use of MDI's    Goals/Expected Outcomes -- -- Complaiance Complaiance            Oxygen Discharge (Final Oxygen Re-Evaluation):  Oxygen Re-Evaluation - 12/22/20 0715       Program Oxygen Prescription   Program Oxygen  Prescription None      Home Oxygen   Home Oxygen Device E-Tanks;Home Concentrator    Sleep Oxygen Prescription None    Home Exercise Oxygen Prescription None    Home Resting Oxygen Prescription None    Compliance with Home Oxygen Use Yes      Goals/Expected Outcomes   Short Term Goals To learn and exhibit compliance with exercise, home and travel O2 prescription;To learn and understand importance of monitoring SPO2 with pulse oximeter and demonstrate accurate use of the pulse oximeter.;To learn and understand importance of maintaining oxygen saturations>88%;To learn and demonstrate proper pursed lip breathing techniques or other breathing techniques. ;To learn and demonstrate proper use of respiratory medications    Long  Term Goals Exhibits compliance with exercise, home  and travel O2 prescription;Verbalizes importance of monitoring SPO2 with pulse oximeter and return demonstration;Maintenance of O2 saturations>88%;Exhibits proper breathing techniques, such as pursed lip breathing or other method taught during program session;Compliance with respiratory medication;Demonstrates proper use of MDI's    Goals/Expected Outcomes Complaiance             Initial Exercise Prescription:  Initial Exercise Prescription - 09/10/20 1500       Date of Initial Exercise RX and Referring Provider   Date 09/10/20    Referring Provider Dr. Gwenette Greet    Expected Discharge Date 01/13/21  NuStep   Level 1    SPM 60    Minutes 22      Arm Ergometer   Level 1    RPM 45    Minutes 17      Prescription Details   Frequency (times per week) 2    Duration Progress to 30 minutes of continuous aerobic without signs/symptoms of physical distress      Intensity   THRR 40-80% of Max Heartrate 59-118    Ratings of Perceived Exertion 11-13    Perceived Dyspnea 0-4      Resistance Training   Training Prescription Yes    Weight 3 lbs    Reps 10-15             Perform Capillary Blood Glucose  checks as needed.  Exercise Prescription Changes:   Exercise Prescription Changes     Row Name 09/21/20 0748 10/05/20 1600 10/07/20 1600 10/12/20 1621 11/02/20 1600     Response to Exercise   Blood Pressure (Admit) 126/74 124/66 -- 132/64 122/60   Blood Pressure (Exercise) 144/82 130/72 -- 122/70 164/72   Blood Pressure (Exit) 108/74 140/76 -- 128/60 120/70   Heart Rate (Admit) 73 bpm 86 bpm -- 82 bpm 81 bpm   Heart Rate (Exercise) 85 bpm 90 bpm -- 103 bpm 90 bpm   Heart Rate (Exit) 71 bpm 82 bpm -- 80 bpm 82 bpm   Oxygen Saturation (Admit) 96 % 95 % -- 94 % 95 %   Oxygen Saturation (Exercise) 94 % 94 % -- 95 % 94 %   Oxygen Saturation (Exit) 96 % 98 % -- 95 % 96 %   Rating of Perceived Exertion (Exercise) 12 11 -- 13 9   Perceived Dyspnea (Exercise) 12 11 -- 13 9   Duration Continue with 30 min of aerobic exercise without signs/symptoms of physical distress. Continue with 30 min of aerobic exercise without signs/symptoms of physical distress. -- Continue with 30 min of aerobic exercise without signs/symptoms of physical distress. Continue with 30 min of aerobic exercise without signs/symptoms of physical distress.   Intensity THRR unchanged THRR unchanged -- THRR unchanged THRR unchanged     Progression   Progression Continue to progress workloads to maintain intensity without signs/symptoms of physical distress. Continue to progress workloads to maintain intensity without signs/symptoms of physical distress. -- Continue to progress workloads to maintain intensity without signs/symptoms of physical distress. Continue to progress workloads to maintain intensity without signs/symptoms of physical distress.     Resistance Training   Training Prescription Yes Yes -- Yes Yes   Weight 3 lbs 4 lbs -- 4 lbs 4 lbs   Reps 10-15 10-15 -- 10-15 10-15   Time 10 Minutes 10 Minutes -- 10 Minutes 10 Minutes     NuStep   Level 1 1 -- 1 1   SPM 68 61 -- 68 77   Minutes 22 22 -- 22 22   METs 1.9  1.8 -- 1.9 2.1     Arm Ergometer   Level 1 1 -- 1.5 2   RPM 44 41 -- 49 69   Minutes 17 17 -- 17 17   METs 1.5 1.5 -- 1.7 2.2     Home Exercise Plan   Plans to continue exercise at -- -- Home (comment) -- --   Frequency -- -- Add 2 additional days to program exercise sessions. -- --   Initial Home Exercises Provided -- -- 10/07/20 -- --    [place] Name  11/16/20 1500 11/23/20 1338 12/16/20 1600         Response to Exercise   Blood Pressure (Admit) 128/64 128/70 130/58     Blood Pressure (Exercise) 132/70 144/78 144/82     Blood Pressure (Exit) 110/62 122/64 118/76     Heart Rate (Admit) 83 bpm 74 bpm 78 bpm     Heart Rate (Exercise) 84 bpm 82 bpm 87 bpm     Heart Rate (Exit) 75 bpm 71 bpm 75 bpm     Oxygen Saturation (Admit) 93 % 94 % 95 %     Oxygen Saturation (Exercise) 95 % 94 % 94 %     Oxygen Saturation (Exit) 94 % 95 % 97 %     Rating of Perceived Exertion (Exercise) _0 Perceived Dyspnea (Exercise) _1 Duration Continue with 30 min of aerobic exercise without signs/symptoms of physical distress. Continue with 30 min of aerobic exercise without signs/symptoms of physical distress. Continue with 30 min of aerobic exercise without signs/symptoms of physical distress.     Intensity THRR unchanged THRR unchanged THRR unchanged           Progression       Progression Continue to progress workloads to maintain intensity without signs/symptoms of physical distress. Continue to progress workloads to maintain intensity without signs/symptoms of physical distress. Continue to progress workloads to maintain intensity without signs/symptoms of physical distress.           Resistance Training       Training Prescription Yes Yes Yes     Weight 4 lbs 4 lbs 4 lbs     Reps 10-15 10-15 10-15     Time 10 Minutes 10 Minutes 10 Minutes           NuStep       Level _2 SPM 85 74 79     Minutes _3 METs 2.1 1.9 2           Arm Ergometer       Level _4 RPM 48 43 69     Minutes _5 METs 1.8 1.6 2.4             Exercise Comments:   Exercise Comments     Row Name 10/07/20 1602           Exercise Comments Reviewed home exercise program with patient today. He is not currently exercising at home. He is going to start walking around his block 2 days per week in addition to rehab.                Exercise Goals and Review:   Exercise Goals     Row Name 09/10/20 1514 10/05/20 1600 11/02/20 1622 12/01/20 1339 12/22/20 0717     Exercise Goals   Increase Physical Activity _6    Intervention Provide advice, education, support and counseling about physical activity/exercise needs.;Develop an individualized exercise prescription for aerobic and resistive training based on initial evaluation findings, risk stratification, comorbidities and participant's personal goals. Provide advice, education, support and counseling about physical activity/exercise needs.;Develop an individualized exercise prescription for aerobic and resistive training based on initial evaluation findings, risk stratification, comorbidities and participant's personal goals. Provide advice, education, support and counseling about physical activity/exercise needs.;Develop an individualized exercise prescription for  aerobic and resistive training based on initial evaluation findings, risk stratification, comorbidities and participant's personal goals. Provide advice, education, support and counseling about physical activity/exercise needs.;Develop an individualized exercise prescription for aerobic and resistive training based on initial evaluation findings, risk stratification, comorbidities and participant's personal goals. Provide advice, education, support and counseling about physical activity/exercise needs.;Develop an individualized exercise prescription for aerobic and resistive training based on initial evaluation findings, risk  stratification, comorbidities and participant's personal goals.   Expected Outcomes Short Term: Attend rehab on a regular basis to increase amount of physical activity.;Long Term: Add in home exercise to make exercise part of routine and to increase amount of physical activity.;Long Term: Exercising regularly at least 3-5 days a week. Short Term: Attend rehab on a regular basis to increase amount of physical activity.;Long Term: Add in home exercise to make exercise part of routine and to increase amount of physical activity.;Long Term: Exercising regularly at least 3-5 days a week. Short Term: Attend rehab on a regular basis to increase amount of physical activity.;Long Term: Add in home exercise to make exercise part of routine and to increase amount of physical activity.;Long Term: Exercising regularly at least 3-5 days a week. Short Term: Attend rehab on a regular basis to increase amount of physical activity.;Long Term: Add in home exercise to make exercise part of routine and to increase amount of physical activity.;Long Term: Exercising regularly at least 3-5 days a week. Short Term: Attend rehab on a regular basis to increase amount of physical activity.;Long Term: Add in home exercise to make exercise part of routine and to increase amount of physical activity.;Long Term: Exercising regularly at least 3-5 days a week.   Increase Strength and Stamina _0    Intervention Provide advice, education, support and counseling about physical activity/exercise needs.;Develop an individualized exercise prescription for aerobic and resistive training based on initial evaluation findings, risk stratification, comorbidities and participant's personal goals. Provide advice, education, support and counseling about physical activity/exercise needs.;Develop an individualized exercise prescription for aerobic and resistive training based on initial evaluation findings, risk stratification, comorbidities and  participant's personal goals. Provide advice, education, support and counseling about physical activity/exercise needs.;Develop an individualized exercise prescription for aerobic and resistive training based on initial evaluation findings, risk stratification, comorbidities and participant's personal goals. Provide advice, education, support and counseling about physical activity/exercise needs.;Develop an individualized exercise prescription for aerobic and resistive training based on initial evaluation findings, risk stratification, comorbidities and participant's personal goals. Provide advice, education, support and counseling about physical activity/exercise needs.;Develop an individualized exercise prescription for aerobic and resistive training based on initial evaluation findings, risk stratification, comorbidities and participant's personal goals.   Expected Outcomes Short Term: Increase workloads from initial exercise prescription for resistance, speed, and METs.;Short Term: Perform resistance training exercises routinely during rehab and add in resistance training at home;Long Term: Improve cardiorespiratory fitness, muscular endurance and strength as measured by increased METs and functional capacity (6MWT) Short Term: Increase workloads from initial exercise prescription for resistance, speed, and METs.;Short Term: Perform resistance training exercises routinely during rehab and add in resistance training at home;Long Term: Improve cardiorespiratory fitness, muscular endurance and strength as measured by increased METs and functional capacity (6MWT) Short Term: Increase workloads from initial exercise prescription for resistance, speed, and METs.;Short Term: Perform resistance training exercises routinely during rehab and add in resistance training at home;Long Term: Improve cardiorespiratory fitness, muscular endurance and strength as measured by increased METs and functional capacity (6MWT) Short  Term:  Increase workloads from initial exercise prescription for resistance, speed, and METs.;Short Term: Perform resistance training exercises routinely during rehab and add in resistance training at home;Long Term: Improve cardiorespiratory fitness, muscular endurance and strength as measured by increased METs and functional capacity (6MWT) Short Term: Increase workloads from initial exercise prescription for resistance, speed, and METs.;Short Term: Perform resistance training exercises routinely during rehab and add in resistance training at home;Long Term: Improve cardiorespiratory fitness, muscular endurance and strength as measured by increased METs and functional capacity (6MWT)   Able to understand and use rate of perceived exertion (RPE) scale _0    Intervention Provide education and explanation on how to use RPE scale Provide education and explanation on how to use RPE scale Provide education and explanation on how to use RPE scale Provide education and explanation on how to use RPE scale Provide education and explanation on how to use RPE scale   Expected Outcomes Short Term: Able to use RPE daily in rehab to express subjective intensity level;Long Term:  Able to use RPE to guide intensity level when exercising independently Short Term: Able to use RPE daily in rehab to express subjective intensity level;Long Term:  Able to use RPE to guide intensity level when exercising independently Short Term: Able to use RPE daily in rehab to express subjective intensity level;Long Term:  Able to use RPE to guide intensity level when exercising independently Short Term: Able to use RPE daily in rehab to express subjective intensity level;Long Term:  Able to use RPE to guide intensity level when exercising independently Short Term: Able to use RPE daily in rehab to express subjective intensity level;Long Term:  Able to use RPE to guide intensity level when exercising independently   Able to  understand and use Dyspnea scale _1    Intervention Provide education and explanation on how to use Dyspnea scale Provide education and explanation on how to use Dyspnea scale Provide education and explanation on how to use Dyspnea scale Provide education and explanation on how to use Dyspnea scale Provide education and explanation on how to use Dyspnea scale   Expected Outcomes Short Term: Able to use Dyspnea scale daily in rehab to express subjective sense of shortness of breath during exertion;Long Term: Able to use Dyspnea scale to guide intensity level when exercising independently Short Term: Able to use Dyspnea scale daily in rehab to express subjective sense of shortness of breath during exertion;Long Term: Able to use Dyspnea scale to guide intensity level when exercising independently Short Term: Able to use Dyspnea scale daily in rehab to express subjective sense of shortness of breath during exertion;Long Term: Able to use Dyspnea scale to guide intensity level when exercising independently Short Term: Able to use Dyspnea scale daily in rehab to express subjective sense of shortness of breath during exertion;Long Term: Able to use Dyspnea scale to guide intensity level when exercising independently Short Term: Able to use Dyspnea scale daily in rehab to express subjective sense of shortness of breath during exertion;Long Term: Able to use Dyspnea scale to guide intensity level when exercising independently   Knowledge and understanding of Target Heart Rate Range (THRR) _2    Intervention Provide education and explanation of THRR including how the numbers were predicted and where they are located for reference Provide education and explanation of THRR including how the numbers were predicted and where they are located for reference Provide education and explanation of THRR including how  the numbers were predicted and where they are located for reference Provide  education and explanation of THRR including how the numbers were predicted and where they are located for reference Provide education and explanation of THRR including how the numbers were predicted and where they are located for reference   Expected Outcomes Short Term: Able to state/look up THRR;Long Term: Able to use THRR to govern intensity when exercising independently;Short Term: Able to use daily as guideline for intensity in rehab Short Term: Able to state/look up THRR;Long Term: Able to use THRR to govern intensity when exercising independently;Short Term: Able to use daily as guideline for intensity in rehab Short Term: Able to state/look up THRR;Long Term: Able to use THRR to govern intensity when exercising independently;Short Term: Able to use daily as guideline for intensity in rehab Short Term: Able to state/look up THRR;Long Term: Able to use THRR to govern intensity when exercising independently;Short Term: Able to use daily as guideline for intensity in rehab Short Term: Able to state/look up THRR;Long Term: Able to use THRR to govern intensity when exercising independently;Short Term: Able to use daily as guideline for intensity in rehab   Understanding of Exercise Prescription _0    Intervention Provide education, explanation, and written materials on patient's individual exercise prescription Provide education, explanation, and written materials on patient's individual exercise prescription Provide education, explanation, and written materials on patient's individual exercise prescription Provide education, explanation, and written materials on patient's individual exercise prescription Provide education, explanation, and written materials on patient's individual exercise prescription   Expected Outcomes Short Term: Able to explain program exercise prescription;Long Term: Able to explain home exercise prescription to exercise independently Short Term: Able to explain program  exercise prescription;Long Term: Able to explain home exercise prescription to exercise independently Short Term: Able to explain program exercise prescription;Long Term: Able to explain home exercise prescription to exercise independently Short Term: Able to explain program exercise prescription;Long Term: Able to explain home exercise prescription to exercise independently Short Term: Able to explain program exercise prescription;Long Term: Able to explain home exercise prescription to exercise independently            Exercise Goals Re-Evaluation :  Exercise Goals Re-Evaluation     Row Name 10/05/20 1600 11/02/20 1600 12/01/20 1340 12/22/20 0718       Exercise Goal Re-Evaluation   Exercise Goals Review Increase Physical Activity;Increase Strength and Stamina;Able to understand and use rate of perceived exertion (RPE) scale;Able to understand and use Dyspnea scale;Knowledge and understanding of Target Heart Rate Range (THRR);Able to check pulse independently;Understanding of Exercise Prescription Increase Physical Activity;Increase Strength and Stamina;Able to understand and use rate of perceived exertion (RPE) scale;Able to understand and use Dyspnea scale;Knowledge and understanding of Target Heart Rate Range (THRR);Able to check pulse independently;Understanding of Exercise Prescription Increase Physical Activity;Increase Strength and Stamina;Able to understand and use rate of perceived exertion (RPE) scale;Able to understand and use Dyspnea scale;Knowledge and understanding of Target Heart Rate Range (THRR);Able to check pulse independently;Understanding of Exercise Prescription Increase Physical Activity;Increase Strength and Stamina;Able to understand and use rate of perceived exertion (RPE) scale;Able to understand and use Dyspnea scale;Knowledge and understanding of Target Heart Rate Range (THRR);Able to check pulse independently;Understanding of Exercise Prescription    Comments Patient  has completed 5 exercise sessions. He has tolerated exercise well. He uses his rescue inhaler most days before the second station. He has not increased intensities with aerobic training since he started the program. He still  takes some breaks and gets short of breath. He is currently working at 1.8 METs on the NuStep. Will continue to monitor and progress as able. Patient has completed 9 exercise sessions. He has had inconsistent attendance and is progressing slowly. He is slowly increasing intensities and his MET level is increasing as well. He has reported that he feels stronger since starting the program. He says that walking is easier with decreased shortness of breath at home. He is currently exercising at 2.1 METs on the NuStep. Will continue to monitor and progress as able. patient has completed 14 exercise sessions. He has had inconsitent attendance throughout rehab. He is progressing slowly. He reports that rehab is helping him at home. He is trying to exercise at home when he has time. He has noticed a difference in his shortness of breath while doing activities. He is currenlty exercising at 1.9 METs on the NuStep. Will continue to monitor and progress as able. Pt has completed 17 exercise sessions. His progress has been limited due to his inconsistent attendance. He often needs quite a bit of motivation while in rehab to push himselft. He does well when I give him a goal for his revolutions per minute or steps per minute while on the equipment. He is currently exercising at 2.4 METs on the stepper. Will continue to monitor and progress as able.    Expected Outcomes Through exercise at rehab and with a home exercise program, patient will achieve their goals. Through exercise at rehab and with a home exercise program, patient will achieve their goals. Through exercise at rehab and with a home exercise program, patient will achieve their goals. Through exercise at rehab and with a home exercise program,  patient will achieve their goals.             Discharge Exercise Prescription (Final Exercise Prescription Changes):  Exercise Prescription Changes - 12/16/20 1600       Response to Exercise   Blood Pressure (Admit) 130/58    Blood Pressure (Exercise) 144/82    Blood Pressure (Exit) 118/76    Heart Rate (Admit) 78 bpm    Heart Rate (Exercise) 87 bpm    Heart Rate (Exit) 75 bpm    Oxygen Saturation (Admit) 95 %    Oxygen Saturation (Exercise) 94 %    Oxygen Saturation (Exit) 97 %    Rating of Perceived Exertion (Exercise) 11    Perceived Dyspnea (Exercise) 11    Duration Continue with 30 min of aerobic exercise without signs/symptoms of physical distress.    Intensity THRR unchanged      Progression   Progression Continue to progress workloads to maintain intensity without signs/symptoms of physical distress.      Resistance Training   Training Prescription Yes    Weight 4 lbs    Reps 10-15    Time 10 Minutes      NuStep   Level 2    SPM 79    Minutes 22    METs 2      Arm Ergometer   Level 2    RPM 69    Minutes 17    METs 2.4             Nutrition:  Target Goals: Understanding of nutrition guidelines, daily intake of sodium <1546m, cholesterol <2025m calories 30% from fat and 7% or less from saturated fats, daily to have 5 or more servings of fruits and vegetables.  Biometrics:  Pre Biometrics - 11/23/20 1342  Pre Biometrics   Weight 110.2 kg              Nutrition Therapy Plan and Nutrition Goals:  Nutrition Therapy & Goals - 09/29/20 1451       Personal Nutrition Goals   Comments Patient scored 85 on his diet assessment. We provide 2 educational sessions with handouts on heart healthy nutrition and offer RD referral.      Intervention Plan   Intervention Nutrition handout(s) given to patient.             Nutrition Assessments:  Nutrition Assessments - 09/10/20 1353       MEDFICTS Scores   Pre Score 85             MEDIFICTS Score Key: ?70 Need to make dietary changes  40-70 Heart Healthy Diet ? 40 Therapeutic Level Cholesterol Diet   Picture Your Plate Scores: <43 Unhealthy dietary pattern with much room for improvement. 41-50 Dietary pattern unlikely to meet recommendations for good health and room for improvement. 51-60 More healthful dietary pattern, with some room for improvement.  >60 Healthy dietary pattern, although there may be some specific behaviors that could be improved.    Nutrition Goals Re-Evaluation:   Nutrition Goals Discharge (Final Nutrition Goals Re-Evaluation):   Psychosocial: Target Goals: Acknowledge presence or absence of significant depression and/or stress, maximize coping skills, provide positive support system. Participant is able to verbalize types and ability to use techniques and skills needed for reducing stress and depression.  Initial Review & Psychosocial Screening:  Initial Psych Review & Screening - 09/10/20 1353       Initial Review   Current issues with None Identified      Family Dynamics   Good Support System? Yes    Comments He has a couple of friends who he grew up with and was in the [employer] with that have always been there for him. He has a sister who he speaks with frequently and lives close by. Overall he feels like he has a good support system.      Barriers   Psychosocial barriers to participate in program The patient should benefit from training in stress management and relaxation.      Screening Interventions   Interventions Encouraged to exercise;Provide feedback about the scores to participant    Expected Outcomes Long Term Goal: Stressors or current issues are controlled or eliminated.;Short Term goal: Identification and review with participant of any Quality of Life or Depression concerns found by scoring the questionnaire.;Long Term goal: The participant improves quality of Life and PHQ9 Scores as seen by post scores and/or  verbalization of changes             Quality of Life Scores:  Quality of Life - 09/10/20 1516       Quality of Life   Select Quality of Life      Quality of Life Scores   Health/Function Pre 14.85 %    Socioeconomic Pre 20.14 %    Psych/Spiritual Pre 26.42 %    Family Pre 13.5 %    GLOBAL Pre 18.06 %            Scores of 19 and below usually indicate a poorer quality of life in these areas.  A difference of  2-3 points is a clinically meaningful difference.  A difference of 2-3 points in the total score of the Quality of Life Index has been associated with significant improvement in overall quality of life, self-image,  physical symptoms, and general health in studies assessing change in quality of life.   PHQ-9: Recent Review Flowsheet Data     Depression screen [facility] 2/9 09/10/2020   Decreased Interest 1   Down, Depressed, Hopeless 1   PHQ - 2 Score 2   Altered sleeping 1   Tired, decreased energy 1   Change in appetite 2   Feeling bad or failure about yourself  0   Trouble concentrating 1   Moving slowly or fidgety/restless 0   Suicidal thoughts 1    PHQ-9 Score 8   Difficult doing work/chores Somewhat difficult      Interpretation of Total Score  Total Score Depression Severity:  1-4 = Minimal depression, 5-9 = Mild depression, 10-14 = Moderate depression, 15-19 = Moderately severe depression, 20-27 = Severe depression   Psychosocial Evaluation and Intervention:  Psychosocial Evaluation - 09/10/20 1517       Psychosocial Evaluation & Interventions   Interventions Stress management education;Relaxation education;Encouraged to exercise with the program and follow exercise prescription    Comments Patient has no barriers in attending the pulmonary rehab program. He is unsure if he wants to do the full 18 weeks, but states that he will do at least 12. Patient lives alone, but does report that he has a good support system. He has several friends in the area which  he has been friends with since he was in high school, and some of them served in [employer] with him. He also has a sister that lives near him who he speaks to regularly. His PHQ-9 was an 9, and his overall QOL was a 18.06. He states that he no longer has any hobbies, as he has gotten older, he feels like he no longer has the ability or energy to engage in his hobbies anymore. His hobbies included working on cars and racing cars. He states that his makes him feel like he does not have a purpose at times, and wonders if it would matter if he was dead. However, he states that he does not have any suicidal thoughts and has no plans of hurting himself. He declined a referral to a counselor and states that he copes well on his own. I think he will benefit from exercise and being around other people. I encouraged him to try to find other hobbies that were not as physically demanding. His overall goals in the program are to lose some weight and to breathe better.    Expected Outcomes The patient's psychosocial issues will decrease as he becomes stronger by attending pulmonary rehab. Through stress management, relaxation management, and exercise, the patient will begin to feel his sense of purpose again, and his QOL will increase.    Continue Psychosocial Services  Follow up required by staff             Psychosocial Re-Evaluation:  Psychosocial Re-Evaluation     [place] Name 09/29/20 1453 10/28/20 0939 11/24/20 1332 12/15/20 1324       Psychosocial Re-Evaluation   Current issues with None Identified None Identified None Identified None Identified    Comments Patient is new to the program completing 3 sessions. He stated during his orientation he had feelings of having no purpose at times duet to not being able to do the things he used to do. He does not have a diagnosis of depression or history of depression but is taking Mirtazepine and wellbutrin. Will continue to monitor. Patient is new to the program  completing  8 sessions. He stated during his orientation he had feelings of having no purpose at times due to not being able to do the things he used to do. He does not have a diagnosis of depression or history of depression but is taking Mirtazepine and wellbutrin. Will continue to monitor. Patient has completed 14 sessions. He continues to have no psychosocial issues identified. He continues to take Mirtazepine and wellbutrin. He is quite but does seem to enjoy coming to class. His attendance has improved during this review period.  Will continue to monitor. Patient has completed 16 sessions. He continues to have no psychosocial issues identified. He continues to take Mirtazepine and wellbutrin. He is quite but does seem to enjoy coming to class. His attendance has improved during this review period.  Will continue to monitor.    Expected Outcomes Patient will continue to have no psychosocial issues identififed. Patient will continue to have no psychosocial issues identififed. Patient will continue to have no psychosocial issues identififed. Patient will continue to have no psychosocial issues identififed.    Interventions Stress management education;Encouraged to attend Pulmonary Rehabilitation for the exercise;Relaxation education Stress management education;Encouraged to attend Pulmonary Rehabilitation for the exercise;Relaxation education Stress management education;Encouraged to attend Pulmonary Rehabilitation for the exercise;Relaxation education Stress management education;Encouraged to attend Pulmonary Rehabilitation for the exercise;Relaxation education    Continue Psychosocial Services  No Follow up required No Follow up required No Follow up required No Follow up required             Psychosocial Discharge (Final Psychosocial Re-Evaluation):  Psychosocial Re-Evaluation - 12/15/20 1324       Psychosocial Re-Evaluation   Current issues with None Identified    Comments Patient has completed  16 sessions. He continues to have no psychosocial issues identified. He continues to take Mirtazepine and wellbutrin. He is quite but does seem to enjoy coming to class. His attendance has improved during this review period.  Will continue to monitor.    Expected Outcomes Patient will continue to have no psychosocial issues identififed.    Interventions Stress management education;Encouraged to attend Pulmonary Rehabilitation for the exercise;Relaxation education    Continue Psychosocial Services  No Follow up required              Education: Education Goals: Education classes will be provided on a weekly basis, covering required topics. Participant will state understanding/return demonstration of topics presented.  Learning Barriers/Preferences:  Learning Barriers/Preferences - 09/10/20 1355       Learning Barriers/Preferences   Learning Barriers None    Learning Preferences Skilled Demonstration;Pictoral;Written Material             Education Topics: How Lungs Work and Diseases: - Discuss the anatomy of the lungs and diseases that can affect the lungs, such as COPD.   Exercise: -Discuss the importance of exercise, FITT principles of exercise, normal and abnormal responses to exercise, and how to exercise safely.   Environmental Irritants: -Discuss types of environmental irritants and how to limit exposure to environmental irritants.   Meds/Inhalers and oxygen: - Discuss respiratory medications, definition of an inhaler and oxygen, and the proper way to use an inhaler and oxygen. Flowsheet Row PULMONARY REHAB CHRONIC OBSTRUCTIVE PULMONARY DISEASE from 12/16/2020 in [place]  Date 10/21/20  Educator KM       Energy Saving Techniques: - Discuss methods to conserve energy and decrease shortness of breath when performing activities of daily living.    Bronchial Hygiene / Breathing Techniques: -  Discuss breathing mechanics, pursed-lip  breathing technique,  proper posture, effective ways to clear airways, and other functional breathing techniques   Cleaning Equipment: - Provides group verbal and written instruction about the health risks of elevated stress, cause of high stress, and healthy ways to reduce stress. Flowsheet Row PULMONARY REHAB CHRONIC OBSTRUCTIVE PULMONARY DISEASE from 12/16/2020 in [place]  Date 11/11/20  Educator Handout  Instruction Review Code 1- Verbalizes Understanding       Nutrition I: Fats: - Discuss the types of cholesterol, what cholesterol does to the body, and how cholesterol levels can be controlled. Flowsheet Row PULMONARY REHAB CHRONIC OBSTRUCTIVE PULMONARY DISEASE from 12/16/2020 in [place]  Date 11/18/20  Educator [facility]  Instruction Review Code 1- Verbalizes Understanding       Nutrition II: Labels: -Discuss the different components of food labels and how to read food labels.   Respiratory Infections: - Discuss the signs and symptoms of respiratory infections, ways to prevent respiratory infections, and the importance of seeking medical treatment when having a respiratory infection.   Stress I: Signs and Symptoms: - Discuss the causes of stress, how stress may lead to anxiety and depression, and ways to limit stress. Flowsheet Row PULMONARY REHAB CHRONIC OBSTRUCTIVE PULMONARY DISEASE from 12/16/2020 in [place]  Date 12/09/20  Educator mk  Instruction Review Code 2- Demonstrated Understanding       Stress II: Relaxation: -Discuss relaxation techniques to limit stress. Flowsheet Row PULMONARY REHAB CHRONIC OBSTRUCTIVE PULMONARY DISEASE from 12/16/2020 in [place]  Date 12/16/20  Educator DJ  Instruction Review Code 1- Verbalizes Understanding       Oxygen for Home/Travel: - Discuss how to prepare for travel when on oxygen and proper ways to transport and store oxygen to  ensure safety. Flowsheet Row PULMONARY REHAB CHRONIC OBSTRUCTIVE PULMONARY DISEASE from 12/16/2020 in [place]  Date 09/23/20  Educator DJ  Instruction Review Code 1- Verbalizes Understanding       Knowledge Questionnaire Score:  Knowledge Questionnaire Score - 09/10/20 1405       Knowledge Questionnaire Score   Pre Score 16/18             Core Components/Risk Factors/Patient Goals at Admission:  Personal Goals and Risk Factors at Admission - 09/10/20 1356       Core Components/Risk Factors/Patient Goals on Admission    Weight Management Yes;Weight Loss    Intervention Weight Management: Develop a combined nutrition and exercise program designed to reach desired caloric intake, while maintaining appropriate intake of nutrient and fiber, sodium and fats, and appropriate energy expenditure required for the weight goal.;Weight Management: Provide education and appropriate resources to help participant work on and attain dietary goals.;Weight Management/Obesity: Establish reasonable short term and long term weight goals.;Obesity: Provide education and appropriate resources to help participant work on and attain dietary goals.    Expected Outcomes Short Term: Continue to assess and modify interventions until short term weight is achieved;Long Term: Adherence to nutrition and physical activity/exercise program aimed toward attainment of established weight goal;Weight Loss: Understanding of general recommendations for a balanced deficit meal plan, which promotes 1-2 lb weight loss per week and includes a negative energy balance of [phone] kcal/d;Understanding of distribution of calorie intake throughout the day with the consumption of 4-5 meals/snacks;Understanding recommendations for meals to include 15-35% energy as protein, 25-35% energy from fat, 35-60% energy from carbohydrates, less than 257m of dietary cholesterol, 20-35 gm of total fiber  daily    Improve  shortness of breath with ADL's Yes    Intervention Provide education, individualized exercise plan and daily activity instruction to help decrease symptoms of SOB with activities of daily living.    Expected Outcomes Short Term: Improve cardiorespiratory fitness to achieve a reduction of symptoms when performing ADLs;Long Term: Be able to perform more ADLs without symptoms or delay the onset of symptoms             Core Components/Risk Factors/Patient Goals Review:   Goals and Risk Factor Review     Row Name 09/29/20 1500 10/28/20 0940 11/24/20 1334 12/15/20 1325       Core Components/Risk Factors/Patient Goals Review   Personal Goals Review Improve shortness of breath with ADL's;Other Improve shortness of breath with ADL's;Other Improve shortness of breath with ADL's;Other Improve shortness of breath with ADL's;Other    Review Patient is new to the program completing 3 sessions. He was referred by the [place] with COPD. His personal goals for the program are to get stronger and improve his breathing. We will continue to monitor his progress as he works toward meeting these goals. Patient has completed 8 sessions gaining 10 lbs since his initial visit. He is doing well in the program. His attendance okay. He has missed the last 2 sessions. He has been progressed. His blood pressure is usually at goal below 130/80. His O2 saturations usually remain above 94% on RA. His personal goals for the program are to get stronger and improve his breathing. We will continue to monitor his progress as he works toward meeting these goals. Patient has completed 14 sessions maintaining his weight since last 30 day review. He is doing well in the program with more consistent attendance and progressions. His blood pressure remaines WNL. His O2 saturations are averaging 94-95% during exercise on RA. His personal goals for the program are to get stronger and improve his breathing. He states he feels he is getting stronger.  We will continue to monitor his progress as he works towards meeting these goals. Patient has completed 16 sessions maintaining his weight since last 30 day review. His attendance has been inconsistent in this 30 day review period only attending 2 sessions. His blood pressure remaines WNL. His O2 saturations are averaging 94-95% during exercise on RA. His personal goals for the program are to get stronger and improve his breathing. He states he feels he is getting stronger. We will continue to monitor his progress as he works towards meeting these goals.    Expected Outcomes Patient will complete the program meeting both program and personal goals. Patient will complete the program meeting both program and personal goals. Patient will complete the program meeting both program and personal goals. Patient will complete the program meeting both program and personal goals.             Core Components/Risk Factors/Patient Goals at Discharge (Final Review):   Goals and Risk Factor Review - 12/15/20 1325       Core Components/Risk Factors/Patient Goals Review   Personal Goals Review Improve shortness of breath with ADL's;Other    Review Patient has completed 16 sessions maintaining his weight since last 30 day review. His attendance has been inconsistent in this 30 day review period only attending 2 sessions. His blood pressure remaines WNL. His O2 saturations are averaging 94-95% during exercise on RA. His personal goals for the program are to get stronger and improve his breathing. He states he feels he  is getting stronger. We will continue to monitor his progress as he works towards meeting these goals.    Expected Outcomes Patient will complete the program meeting both program and personal goals.             ITP Comments:   Comments: ITP REVIEW Pt is making expected progress toward pulmonary rehab goals after completing 18 sessions. Recommend continued exercise, life style modification,  education, and utilization of breathing techniques to increase stamina and strength and decrease shortness of breath with exertion.

## 2020-12-23 ENCOUNTER — Other Ambulatory Visit: Payer: Self-pay

## 2020-12-23 ENCOUNTER — Encounter (HOSPITAL_COMMUNITY)
Admission: RE | Admit: 2020-12-23 | Discharge: 2020-12-23 | Disposition: A | Discharge status: Home / Self Care | Payer: No Typology Code available for payment source | Source: Ambulatory Visit | Attending: Pulmonary Disease | Admitting: Pulmonary Disease

## 2020-12-23 DIAGNOSIS — J449 Chronic obstructive pulmonary disease, unspecified: Secondary | ICD-10-CM | POA: Diagnosis not present

## 2020-12-23 NOTE — Progress Notes (Signed)
Daily Session Note  Patient Details  Name: Wesley Daugherty MRN: [id number] Date of Birth: [birth year] Referring Provider:   Flowsheet Row PULMONARY REHAB COPD ORIENTATION from 09/10/2020 in [facility]  Referring Provider Dr. Gwenette Greet       Encounter Date: 12/23/2020  Check In:  Session Check In - 12/23/20 1449       Check-In   Supervising physician immediately available to respond to emergencies [facility] MD immediately available    Physician(s) Dr. Harrington Challenger    Location [facility]    Staff Present Aundra Dubin, RN, Bjorn Loser, MS, ACSM-CEP, Exercise Physiologist    Virtual Visit No    Medication changes reported     No    Fall or balance concerns reported    No    Tobacco Cessation No Change    Warm-up and Cool-down Performed as group-led instruction    Resistance Training Performed Yes    VAD Patient? No    PAD/SET Patient? No      Pain Assessment   Currently in Pain? No/denies    Pain Score 0-No pain    Multiple Pain Sites No             Capillary Blood Glucose: No results found for this or any previous visit (from the past 24 hour(s)).    Social History   Tobacco Use  Smoking Status Former   Packs/day: 0.50   Years: 15.00   Pack years: 7.50   Types: Cigarettes   Start date: 29   Quit date: 1998   Years since quitting: 24.4  Smokeless Tobacco Never    Goals Met:  Proper associated with RPD/PD & O2 Sat Independence with exercise equipment Using PLB without cueing & demonstrates good technique Exercise tolerated well No report of cardiac concerns or symptoms Strength training completed today  Goals Unmet:  Not Applicable  Comments: Check out 1600.   Dr. Kathie Dike is Medical Director for [facility].

## 2020-12-28 ENCOUNTER — Encounter (HOSPITAL_COMMUNITY): Payer: No Typology Code available for payment source

## 2020-12-30 ENCOUNTER — Encounter (HOSPITAL_COMMUNITY)
Admission: RE | Admit: 2020-12-30 | Discharge: 2020-12-30 | Disposition: A | Discharge status: Home / Self Care | Payer: No Typology Code available for payment source | Source: Ambulatory Visit | Attending: Pulmonary Disease | Admitting: Pulmonary Disease

## 2020-12-30 ENCOUNTER — Other Ambulatory Visit: Payer: Self-pay

## 2020-12-30 DIAGNOSIS — J449 Chronic obstructive pulmonary disease, unspecified: Secondary | ICD-10-CM

## 2020-12-30 NOTE — Progress Notes (Signed)
Daily Session Note  Patient Details  Name: Wesley Daugherty MRN: [id number] Date of Birth: Jun 12, 1946 Referring Provider:   Flowsheet Row PULMONARY REHAB COPD ORIENTATION from 09/10/2020 in [place]  Referring Provider Dr. Gwenette Greet       Encounter Date: 12/30/2020  Check In:  Session Check In - 12/30/20 1500       Check-In   Supervising physician immediately available to respond to emergencies [facility] immediately available    Physician(s) Dr. Domenic Polite    Location [facility]    Staff Present Geanie Cooley, RN;Dalton Kris Mouton, MS, ACSM-CEP, Exercise Physiologist    Virtual Visit No    Medication changes reported     No    Fall or balance concerns reported    No    Tobacco Cessation No Change    Warm-up and Cool-down Performed as group-led instruction    Resistance Training Performed Yes    VAD Patient? No    PAD/SET Patient? No      Pain Assessment   Currently in Pain? No/denies    Pain Score 0-No pain    Multiple Pain Sites No             Capillary Blood Glucose: No results found for this or any previous visit (from the past 24 hour(s)).    Social History   Tobacco Use  Smoking Status Former   Packs/day: 0.50   Years: 15.00   Pack years: 7.50   Types: Cigarettes   Start date: 22   Quit date: 1998   Years since quitting: 24.5  Smokeless Tobacco Never    Goals Met:  Proper associated with RPD/PD & O2 Sat Independence with exercise equipment Using PLB without cueing & demonstrates good technique Exercise tolerated well No report of cardiac concerns or symptoms Strength training completed today  Goals Unmet:  Not Applicable  Comments: check out @ 4:00pm   Dr. Kathie Dike is Medical Director for [facility].

## 2020-12-31 DIAGNOSIS — R911 Solitary pulmonary nodule: Secondary | ICD-10-CM | POA: Diagnosis not present

## 2020-12-31 DIAGNOSIS — R918 Other nonspecific abnormal finding of lung field: Secondary | ICD-10-CM | POA: Diagnosis not present

## 2021-01-04 ENCOUNTER — Encounter (HOSPITAL_COMMUNITY)
Admission: RE | Admit: 2021-01-04 | Discharge: 2021-01-04 | Disposition: A | Discharge status: Home / Self Care | Payer: No Typology Code available for payment source | Source: Ambulatory Visit | Attending: Pulmonary Disease | Admitting: Pulmonary Disease

## 2021-01-04 ENCOUNTER — Other Ambulatory Visit: Payer: Self-pay

## 2021-01-04 VITALS — Wt 241.0 lb

## 2021-01-04 DIAGNOSIS — J45909 Unspecified asthma, uncomplicated: Secondary | ICD-10-CM | POA: Diagnosis not present

## 2021-01-04 DIAGNOSIS — J449 Chronic obstructive pulmonary disease, unspecified: Secondary | ICD-10-CM | POA: Insufficient documentation

## 2021-01-04 DIAGNOSIS — I10 Essential (primary) hypertension: Secondary | ICD-10-CM | POA: Diagnosis not present

## 2021-01-04 DIAGNOSIS — Z299 Encounter for prophylactic measures, unspecified: Secondary | ICD-10-CM | POA: Diagnosis not present

## 2021-01-04 NOTE — Progress Notes (Signed)
Daily Session Note  Patient Details  Name: Wesley Daugherty MRN: [id number] Date of Birth: 26-Apr-1946 Referring Provider:   Flowsheet Row PULMONARY REHAB COPD ORIENTATION from 09/10/2020 in [place]  Referring Provider Dr. Gwenette Greet       Encounter Date: 01/04/2021  Check In:  Session Check In - 01/04/21 1500       Check-In   Supervising physician immediately available to respond to emergencies [facility] immediately available    Physician(s) Dr. Johnsie Cancel    Location [facility]    Staff Present Hoy Register, MS, ACSM-CEP, Exercise Physiologist    Virtual Visit No    Medication changes reported     No    Fall or balance concerns reported    No    Tobacco Cessation No Change    Warm-up and Cool-down Performed as group-led instruction    Resistance Training Performed Yes    VAD Patient? No    PAD/SET Patient? No      Pain Assessment   Currently in Pain? No/denies    Pain Score 0-No pain    Multiple Pain Sites No             Capillary Blood Glucose: No results found for this or any previous visit (from the past 24 hour(s)).    Social History   Tobacco Use  Smoking Status Former   Packs/day: 0.50   Years: 15.00   Pack years: 7.50   Types: Cigarettes   Start date: 78   Quit date: 1998   Years since quitting: 24.5  Smokeless Tobacco Never    Goals Met:  Independence with exercise equipment Exercise tolerated well No report of cardiac concerns or symptoms Strength training completed today  Goals Unmet:  Not Applicable  Comments: checkout time is 1600   Dr. Kathie Dike is Market researcher for [facility].

## 2021-01-06 ENCOUNTER — Encounter (HOSPITAL_COMMUNITY): Payer: No Typology Code available for payment source

## 2021-01-11 ENCOUNTER — Encounter (HOSPITAL_COMMUNITY)
Admission: RE | Admit: 2021-01-11 | Discharge: 2021-01-11 | Disposition: A | Discharge status: Home / Self Care | Payer: No Typology Code available for payment source | Source: Ambulatory Visit | Attending: Pulmonary Disease | Admitting: Pulmonary Disease

## 2021-01-11 ENCOUNTER — Other Ambulatory Visit: Payer: Self-pay

## 2021-01-11 VITALS — Ht 69.0 in | Wt 240.7 lb

## 2021-01-11 DIAGNOSIS — J449 Chronic obstructive pulmonary disease, unspecified: Secondary | ICD-10-CM | POA: Diagnosis not present

## 2021-01-11 NOTE — Progress Notes (Signed)
Daily Session Note  Patient Details  Name: Wesley Daugherty MRN: [id number] Date of Birth: [birth year] Referring Provider:   Flowsheet Row PULMONARY REHAB COPD ORIENTATION from 09/10/2020 in [facility]  Referring Provider Dr. Gwenette Greet       Encounter Date: 01/11/2021  Check In:  Session Check In - 01/11/21 1500       Check-In   Supervising physician immediately available to respond to emergencies [facility] immediately available    Physician(s) Dr. Domenic Polite    Location [facility]    Staff Present Geanie Cooley, RN;Dalton Kris Mouton, MS, ACSM-CEP, Exercise Physiologist    Virtual Visit No    Medication changes reported     No    Fall or balance concerns reported    No    Tobacco Cessation No Change    Warm-up and Cool-down Performed as group-led instruction    Resistance Training Performed Yes    VAD Patient? No      Pain Assessment   Currently in Pain? No/denies    Pain Score 0-No pain    Multiple Pain Sites No             Capillary Blood Glucose: No results found for this or any previous visit (from the past 24 hour(s)).    Social History   Tobacco Use  Smoking Status Former   Packs/day: 0.50   Years: 15.00   Pack years: 7.50   Types: Cigarettes   Start date: 76   Quit date: 1998   Years since quitting: 24.5  Smokeless Tobacco Never    Goals Met:  Proper associated with RPD/PD & O2 Sat Independence with exercise equipment Using PLB without cueing & demonstrates good technique Exercise tolerated well No report of cardiac concerns or symptoms Strength training completed today  Goals Unmet:  Not Applicable  Comments: check out @ 4:00pm   Dr. Kathie Dike is Medical Director for [facility] Pul[facility].

## 2021-01-13 ENCOUNTER — Other Ambulatory Visit: Payer: Self-pay

## 2021-01-13 ENCOUNTER — Encounter (HOSPITAL_COMMUNITY)
Admission: RE | Admit: 2021-01-13 | Discharge: 2021-01-13 | Disposition: A | Discharge status: Home / Self Care | Payer: No Typology Code available for payment source | Source: Ambulatory Visit | Attending: Pulmonary Disease | Admitting: Pulmonary Disease

## 2021-01-13 DIAGNOSIS — J449 Chronic obstructive pulmonary disease, unspecified: Secondary | ICD-10-CM | POA: Diagnosis not present

## 2021-01-13 NOTE — Progress Notes (Signed)
Daily Session Note  Patient Details  Name: Wesley Daugherty MRN: [id number] Date of Birth: Apr 22, 1946 Referring Provider:   Flowsheet Row PULMONARY REHAB COPD ORIENTATION from 09/10/2020 in [place]  Referring Provider Dr. Gwenette Greet       Encounter Date: 01/13/2021  Check In:  Session Check In - 01/13/21 1500       Check-In   Supervising physician immediately available to respond to emergencies [facility] immediately available    Physician(s) Dr. Domenic Polite    Location [facility]    Staff Present Geanie Cooley, RN;Naksh Radi Wynetta Emery, RN, BSN    Virtual Visit No    Medication changes reported     No    Fall or balance concerns reported    No    Tobacco Cessation No Change    Warm-up and Cool-down Performed as group-led instruction    Resistance Training Performed Yes    VAD Patient? No    PAD/SET Patient? No      Pain Assessment   Currently in Pain? No/denies    Pain Score 0-No pain    Multiple Pain Sites No             Capillary Blood Glucose: No results found for this or any previous visit (from the past 24 hour(s)).    Social History   Tobacco Use  Smoking Status Former   Packs/day: 0.50   Years: 15.00   Pack years: 7.50   Types: Cigarettes   Start date: 1974   Quit date: 1998   Years since quitting: 24.5  Smokeless Tobacco Never    Goals Met:  Proper associated with RPD/PD & O2 Sat Improved SOB with ADL's Using PLB without cueing & demonstrates good technique Exercise tolerated well No report of cardiac concerns or symptoms Strength training completed today  Goals Unmet:  Not Applicable  Comments: Check out 1600.   Dr. Kathie Dike is Medical Director for [facility] Re[facility].

## 2021-01-17 NOTE — Progress Notes (Signed)
Discharge Progress Report  Patient Details  Name: Wesley Daugherty MRN: [id number] Date of Birth: 1945/10/11 Referring Provider:   Flowsheet Row PULMONARY REHAB COPD ORIENTATION from 09/10/2020 in [place]  Referring Provider Dr. Gwenette Greet        Number of Visits: 23  Reason for Discharge:  Patient reached a stable level of exercise. Patient independent in their exercise. Patient has met program and personal goals.  Smoking History:  Social History   Tobacco Use  Smoking Status Former   Packs/day: 0.50   Years: 15.00   Pack years: 7.50   Types: Cigarettes   Start date: 37   Quit date: 1998   Years since quitting: 24.5  Smokeless Tobacco Never    Diagnosis:  Chronic obstructive pulmonary disease, unspecified COPD type ([place])  ADL UCSD:  Pulmonary Assessment Scores     Row Name 09/10/20 1349 01/17/21 0749       ADL UCSD   SOB Score total 43 68         CAT Score      CAT Score 23 23         mMRC Score      mMRC Score 3 1            Initial Exercise Prescription:  Initial Exercise Prescription - 09/10/20 1500       Date of Initial Exercise RX and Referring Provider   Date 09/10/20    Referring Provider Dr. Gwenette Greet    Expected Discharge Date 01/13/21      NuStep   Level 1    SPM 60    Minutes 22      Arm Ergometer   Level 1    RPM 45    Minutes 17      Prescription Details   Frequency (times per week) 2    Duration Progress to 30 minutes of continuous aerobic without signs/symptoms of physical distress      Intensity   THRR 40-80% of Max Heartrate 59-118    Ratings of Perceived Exertion 11-13    Perceived Dyspnea 0-4      Resistance Training   Training Prescription Yes    Weight 3 lbs    Reps 10-15             Discharge Exercise Prescription (Final Exercise Prescription Changes):  Exercise Prescription Changes - 01/04/21 1600       Response to Exercise   Blood Pressure (Admit) 124/68    Blood  Pressure (Exercise) 154/74    Blood Pressure (Exit) 112/68    Heart Rate (Admit) 78 bpm    Heart Rate (Exercise) 85 bpm    Heart Rate (Exit) 80 bpm    Oxygen Saturation (Admit) 94 %    Oxygen Saturation (Exercise) 93 %    Oxygen Saturation (Exit) 97 %    Rating of Perceived Exertion (Exercise) 11    Perceived Dyspnea (Exercise) 11    Duration Continue with 30 min of aerobic exercise without signs/symptoms of physical distress.    Intensity THRR unchanged      Progression   Progression Continue to progress workloads to maintain intensity without signs/symptoms of physical distress.      Resistance Training   Training Prescription Yes    Weight 4 lbs    Reps 10-15    Time 10 Minutes      NuStep   Level 3    SPM 81    Minutes 22    METs 2.1  Arm Ergometer   Level 2    RPM 63    Minutes 17    METs 2.2             Functional Capacity:  6 Minute Walk     Row Name 09/10/20 1511 01/11/21 1606       6 Minute Walk   Phase Initial Discharge    Distance 1100 feet 1100 feet    Distance Feet Change -- 0 ft    Walk Time 6 minutes 6 minutes    # of Rest Breaks 0 0    MPH 2.08 2.08    METS 1.99 1.98    RPE 12 10    Perceived Dyspnea  13 10    VO2 Peak 6.98 6.93    Symptoms No No    Resting HR 65 bpm 67 bpm    Resting BP 120/74 122/70    Resting Oxygen Saturation  96 % 94 %    Exercise Oxygen Saturation  during 6 min walk 90 % 92 %    Max Ex. HR 85 bpm 96 bpm    Max Ex. BP 144/74 136/70    2 Minute Post BP 136/74 110/70         Interval HR      1 Minute HR 72 97    2 Minute HR 77 89    3 Minute HR 84 91    4 Minute HR 85 96    5 Minute HR 81 90    6 Minute HR 83 91    2 Minute Post HR 65 69    Interval Heart Rate? Yes Yes         Interval Oxygen      Interval Oxygen? Yes Yes    Baseline Oxygen Saturation % 96 % 94 %    1 Minute Oxygen Saturation % 93 % 98 %    1 Minute Liters of Oxygen 0 L 0 L    2 Minute Oxygen Saturation % 91 % 93 %    2 Minute  Liters of Oxygen 0 L 0 L    3 Minute Oxygen Saturation % 90 % 92 %    3 Minute Liters of Oxygen 0 L 0 L    4 Minute Oxygen Saturation % 90 % 92 %    4 Minute Liters of Oxygen 0 L 0 L    5 Minute Oxygen Saturation % 91 % 92 %    5 Minute Liters of Oxygen 0 L 0 L    6 Minute Oxygen Saturation % 91 % 92 %    6 Minute Liters of Oxygen 0 L 0 L    2 Minute Post Oxygen Saturation % 96 % 96 %    2 Minute Post Liters of Oxygen 0 L 0 L            Psychological, QOL, Others - Outcomes: PHQ 2/9: Depression screen [facility] 2/9 01/17/2021 09/10/2020  Decreased Interest 2 1  Down, Depressed, Hopeless 1 1  PHQ - 2 Score 3 2  Altered sleeping 2 1  Tired, decreased energy 2 1  Change in appetite 2 2  Feeling bad or failure about yourself  1 0  Trouble concentrating 0 1  Moving slowly or fidgety/restless 0 0  Suicidal thoughts 1 1  PHQ-9 Score 11 8  Difficult doing work/chores Somewhat difficult Somewhat difficult    Quality of Life:  Quality of Life - 01/17/21 0747  Quality of Life Scores   Health/Function Pre 14.85 %    Health/Function Post 21.88 %    Health/Function % Change 47.34 %    Socioeconomic Pre 20.14 %    Socioeconomic Post 24.21 %    Socioeconomic % Change  20.21 %    Psych/Spiritual Pre 26.42 %    Psych/Spiritual Post 25.07 %    Psych/Spiritual % Change -5.11 %    Family Pre 13.5 %    Family Post 22.8 %    Family % Change 68.89 %    GLOBAL Pre 18.06 %    GLOBAL Post 23.11 %    GLOBAL % Change 27.96 %             Personal Goals: Goals established at orientation with interventions provided to work toward goal.  Personal Goals and Risk Factors at Admission - 09/10/20 1356       Core Components/Risk Factors/Patient Goals on Admission    Weight Management Yes;Weight Loss    Intervention Weight Management: Develop a combined nutrition and exercise program designed to reach desired caloric intake, while maintaining appropriate intake of nutrient and fiber, sodium  and fats, and appropriate energy expenditure required for the weight goal.;Weight Management: Provide education and appropriate resources to help participant work on and attain dietary goals.;Weight Management/Obesity: Establish reasonable short term and long term weight goals.;Obesity: Provide education and appropriate resources to help participant work on and attain dietary goals.    Expected Outcomes Short Term: Continue to assess and modify interventions until short term weight is achieved;Long Term: Adherence to nutrition and physical activity/exercise program aimed toward attainment of established weight goal;Weight Loss: Understanding of general recommendations for a balanced deficit meal plan, which promotes 1-2 lb weight loss per week and includes a negative energy balance of [phone] kcal/d;Understanding of distribution of calorie intake throughout the day with the consumption of 4-5 meals/snacks;Understanding recommendations for meals to include 15-35% energy as protein, 25-35% energy from fat, 35-60% energy from carbohydrates, less than $RemoveB'200mg'XcGohmhq$  of dietary cholesterol, 20-35 gm of total fiber daily    Improve shortness of breath with ADL's Yes    Intervention Provide education, individualized exercise plan and daily activity instruction to help decrease symptoms of SOB with activities of daily living.    Expected Outcomes Short Term: Improve cardiorespiratory fitness to achieve a reduction of symptoms when performing ADLs;Long Term: Be able to perform more ADLs without symptoms or delay the onset of symptoms              Personal Goals Discharge:  Goals and Risk Factor Review     Row Name 09/29/20 1500 10/28/20 0940 11/24/20 1334 12/15/20 1325 01/17/21 0750     Core Components/Risk Factors/Patient Goals Review   Personal Goals Review Improve shortness of breath with ADL's;Other Improve shortness of breath with ADL's;Other Improve shortness of breath with ADL's;Other Improve shortness of  breath with ADL's;Other Improve shortness of breath with ADL's;Other   Review Patient is new to the program completing 3 sessions. He was referred by the [facility] with COPD. His personal goals for the program are to get stronger and improve his breathing. We will continue to monitor his progress as he works toward meeting these goals. Patient has completed 8 sessions gaining 10 lbs since his initial visit. He is doing well in the program. His attendance okay. He has missed the last 2 sessions. He has been progressed. His blood pressure is usually at goal below 130/80. His O2 saturations usually remain above  94% on RA. His personal goals for the program are to get stronger and improve his breathing. We will continue to monitor his progress as he works toward meeting these goals. Patient has completed 14 sessions maintaining his weight since last 30 day review. He is doing well in the program with more consistent attendance and progressions. His blood pressure remaines WNL. His O2 saturations are averaging 94-95% during exercise on RA. His personal goals for the program are to get stronger and improve his breathing. He states he feels he is getting stronger. We will continue to monitor his progress as he works towards meeting these goals. Patient has completed 16 sessions maintaining his weight since last 30 day review. His attendance has been inconsistent in this 30 day review period only attending 2 sessions. His blood pressure remaines WNL. His O2 saturations are averaging 94-95% during exercise on RA. His personal goals for the program are to get stronger and improve his breathing. He states he feels he is getting stronger. We will continue to monitor his progress as he works towards meeting these goals. Pt graduated after attending 22 exercise sessions. His attendance was sporadic throughout and was not consistent. He gained 4.6 kg while he was in the program. He did report that he was able to decrease his SOB with  activity while in the program.   Expected Outcomes Patient will complete the program meeting both program and personal goals. Patient will complete the program meeting both program and personal goals. Patient will complete the program meeting both program and personal goals. Patient will complete the program meeting both program and personal goals. Patient will continue to work towards his goals post discharge.            Exercise Goals and Review:  Exercise Goals     Row Name 09/10/20 1514 10/05/20 1600 11/02/20 1622 12/01/20 1339 12/22/20 0717     Exercise Goals   Increase Physical Activity Yes Yes Yes Yes Yes   Intervention Provide advice, education, support and counseling about physical activity/exercise needs.;Develop an individualized exercise prescription for aerobic and resistive training based on initial evaluation findings, risk stratification, comorbidities and participant's personal goals. Provide advice, education, support and counseling about physical activity/exercise needs.;Develop an individualized exercise prescription for aerobic and resistive training based on initial evaluation findings, risk stratification, comorbidities and participant's personal goals. Provide advice, education, support and counseling about physical activity/exercise needs.;Develop an individualized exercise prescription for aerobic and resistive training based on initial evaluation findings, risk stratification, comorbidities and participant's personal goals. Provide advice, education, support and counseling about physical activity/exercise needs.;Develop an individualized exercise prescription for aerobic and resistive training based on initial evaluation findings, risk stratification, comorbidities and participant's personal goals. Provide advice, education, support and counseling about physical activity/exercise needs.;Develop an individualized exercise prescription for aerobic and resistive training based  on initial evaluation findings, risk stratification, comorbidities and participant's personal goals.   Expected Outcomes Short Term: Attend rehab on a regular basis to increase amount of physical activity.;Long Term: Add in home exercise to make exercise part of routine and to increase amount of physical activity.;Long Term: Exercising regularly at least 3-5 days a week. Short Term: Attend rehab on a regular basis to increase amount of physical activity.;Long Term: Add in home exercise to make exercise part of routine and to increase amount of physical activity.;Long Term: Exercising regularly at least 3-5 days a week. Short Term: Attend rehab on a regular basis to increase amount of physical activity.;Long Term: Add  in home exercise to make exercise part of routine and to increase amount of physical activity.;Long Term: Exercising regularly at least 3-5 days a week. Short Term: Attend rehab on a regular basis to increase amount of physical activity.;Long Term: Add in home exercise to make exercise part of routine and to increase amount of physical activity.;Long Term: Exercising regularly at least 3-5 days a week. Short Term: Attend rehab on a regular basis to increase amount of physical activity.;Long Term: Add in home exercise to make exercise part of routine and to increase amount of physical activity.;Long Term: Exercising regularly at least 3-5 days a week.   Increase Strength and Stamina Yes Yes Yes Yes Yes   Intervention Provide advice, education, support and counseling about physical activity/exercise needs.;Develop an individualized exercise prescription for aerobic and resistive training based on initial evaluation findings, risk stratification, comorbidities and participant's personal goals. Provide advice, education, support and counseling about physical activity/exercise needs.;Develop an individualized exercise prescription for aerobic and resistive training based on initial evaluation findings,  risk stratification, comorbidities and participant's personal goals. Provide advice, education, support and counseling about physical activity/exercise needs.;Develop an individualized exercise prescription for aerobic and resistive training based on initial evaluation findings, risk stratification, comorbidities and participant's personal goals. Provide advice, education, support and counseling about physical activity/exercise needs.;Develop an individualized exercise prescription for aerobic and resistive training based on initial evaluation findings, risk stratification, comorbidities and participant's personal goals. Provide advice, education, support and counseling about physical activity/exercise needs.;Develop an individualized exercise prescription for aerobic and resistive training based on initial evaluation findings, risk stratification, comorbidities and participant's personal goals.   Expected Outcomes Short Term: Increase workloads from initial exercise prescription for resistance, speed, and METs.;Short Term: Perform resistance training exercises routinely during rehab and add in resistance training at home;Long Term: Improve cardiorespiratory fitness, muscular endurance and strength as measured by increased METs and functional capacity (6MWT) Short Term: Increase workloads from initial exercise prescription for resistance, speed, and METs.;Short Term: Perform resistance training exercises routinely during rehab and add in resistance training at home;Long Term: Improve cardiorespiratory fitness, muscular endurance and strength as measured by increased METs and functional capacity (6MWT) Short Term: Increase workloads from initial exercise prescription for resistance, speed, and METs.;Short Term: Perform resistance training exercises routinely during rehab and add in resistance training at home;Long Term: Improve cardiorespiratory fitness, muscular endurance and strength as measured by increased METs  and functional capacity (6MWT) Short Term: Increase workloads from initial exercise prescription for resistance, speed, and METs.;Short Term: Perform resistance training exercises routinely during rehab and add in resistance training at home;Long Term: Improve cardiorespiratory fitness, muscular endurance and strength as measured by increased METs and functional capacity (6MWT) Short Term: Increase workloads from initial exercise prescription for resistance, speed, and METs.;Short Term: Perform resistance training exercises routinely during rehab and add in resistance training at home;Long Term: Improve cardiorespiratory fitness, muscular endurance and strength as measured by increased METs and functional capacity (6MWT)   Able to understand and use rate of perceived exertion (RPE) scale Yes Yes Yes Yes Yes   Intervention Provide education and explanation on how to use RPE scale Provide education and explanation on how to use RPE scale Provide education and explanation on how to use RPE scale Provide education and explanation on how to use RPE scale Provide education and explanation on how to use RPE scale   Expected Outcomes Short Term: Able to use RPE daily in rehab to express subjective intensity level;Long Term:  Able to  use RPE to guide intensity level when exercising independently Short Term: Able to use RPE daily in rehab to express subjective intensity level;Long Term:  Able to use RPE to guide intensity level when exercising independently Short Term: Able to use RPE daily in rehab to express subjective intensity level;Long Term:  Able to use RPE to guide intensity level when exercising independently Short Term: Able to use RPE daily in rehab to express subjective intensity level;Long Term:  Able to use RPE to guide intensity level when exercising independently Short Term: Able to use RPE daily in rehab to express subjective intensity level;Long Term:  Able to use RPE to guide intensity level when  exercising independently   Able to understand and use Dyspnea scale Yes Yes Yes Yes Yes   Intervention Provide education and explanation on how to use Dyspnea scale Provide education and explanation on how to use Dyspnea scale Provide education and explanation on how to use Dyspnea scale Provide education and explanation on how to use Dyspnea scale Provide education and explanation on how to use Dyspnea scale   Expected Outcomes Short Term: Able to use Dyspnea scale daily in rehab to express subjective sense of shortness of breath during exertion;Long Term: Able to use Dyspnea scale to guide intensity level when exercising independently Short Term: Able to use Dyspnea scale daily in rehab to express subjective sense of shortness of breath during exertion;Long Term: Able to use Dyspnea scale to guide intensity level when exercising independently Short Term: Able to use Dyspnea scale daily in rehab to express subjective sense of shortness of breath during exertion;Long Term: Able to use Dyspnea scale to guide intensity level when exercising independently Short Term: Able to use Dyspnea scale daily in rehab to express subjective sense of shortness of breath during exertion;Long Term: Able to use Dyspnea scale to guide intensity level when exercising independently Short Term: Able to use Dyspnea scale daily in rehab to express subjective sense of shortness of breath during exertion;Long Term: Able to use Dyspnea scale to guide intensity level when exercising independently   Knowledge and understanding of Target Heart Rate Range (THRR) Yes Yes Yes Yes Yes   Intervention Provide education and explanation of THRR including how the numbers were predicted and where they are located for reference Provide education and explanation of THRR including how the numbers were predicted and where they are located for reference Provide education and explanation of THRR including how the numbers were predicted and where they are  located for reference Provide education and explanation of THRR including how the numbers were predicted and where they are located for reference Provide education and explanation of THRR including how the numbers were predicted and where they are located for reference   Expected Outcomes Short Term: Able to state/look up THRR;Long Term: Able to use THRR to govern intensity when exercising independently;Short Term: Able to use daily as guideline for intensity in rehab Short Term: Able to state/look up THRR;Long Term: Able to use THRR to govern intensity when exercising independently;Short Term: Able to use daily as guideline for intensity in rehab Short Term: Able to state/look up THRR;Long Term: Able to use THRR to govern intensity when exercising independently;Short Term: Able to use daily as guideline for intensity in rehab Short Term: Able to state/look up THRR;Long Term: Able to use THRR to govern intensity when exercising independently;Short Term: Able to use daily as guideline for intensity in rehab Short Term: Able to state/look up THRR;Long Term: Able to use  THRR to govern intensity when exercising independently;Short Term: Able to use daily as guideline for intensity in rehab   Understanding of Exercise Prescription Yes Yes Yes Yes Yes   Intervention Provide education, explanation, and written materials on patient's individual exercise prescription Provide education, explanation, and written materials on patient's individual exercise prescription Provide education, explanation, and written materials on patient's individual exercise prescription Provide education, explanation, and written materials on patient's individual exercise prescription Provide education, explanation, and written materials on patient's individual exercise prescription   Expected Outcomes Short Term: Able to explain program exercise prescription;Long Term: Able to explain home exercise prescription to exercise independently Short  Term: Able to explain program exercise prescription;Long Term: Able to explain home exercise prescription to exercise independently Short Term: Able to explain program exercise prescription;Long Term: Able to explain home exercise prescription to exercise independently Short Term: Able to explain program exercise prescription;Long Term: Able to explain home exercise prescription to exercise independently Short Term: Able to explain program exercise prescription;Long Term: Able to explain home exercise prescription to exercise independently            Exercise Goals Re-Evaluation:  Exercise Goals Re-Evaluation     Row Name 10/05/20 1600 11/02/20 1600 12/01/20 1340 12/22/20 0718       Exercise Goal Re-Evaluation   Exercise Goals Review Increase Physical Activity;Increase Strength and Stamina;Able to understand and use rate of perceived exertion (RPE) scale;Able to understand and use Dyspnea scale;Knowledge and understanding of Target Heart Rate Range (THRR);Able to check pulse independently;Understanding of Exercise Prescription Increase Physical Activity;Increase Strength and Stamina;Able to understand and use rate of perceived exertion (RPE) scale;Able to understand and use Dyspnea scale;Knowledge and understanding of Target Heart Rate Range (THRR);Able to check pulse independently;Understanding of Exercise Prescription Increase Physical Activity;Increase Strength and Stamina;Able to understand and use rate of perceived exertion (RPE) scale;Able to understand and use Dyspnea scale;Knowledge and understanding of Target Heart Rate Range (THRR);Able to check pulse independently;Understanding of Exercise Prescription Increase Physical Activity;Increase Strength and Stamina;Able to understand and use rate of perceived exertion (RPE) scale;Able to understand and use Dyspnea scale;Knowledge and understanding of Target Heart Rate Range (THRR);Able to check pulse independently;Understanding of Exercise  Prescription    Comments Patient has completed 5 exercise sessions. He has tolerated exercise well. He uses his rescue inhaler most days before the second station. He has not increased intensities with aerobic training since he started the program. He still takes some breaks and gets short of breath. He is currently working at 1.8 METs on the NuStep. Will continue to monitor and progress as able. Patient has completed 9 exercise sessions. He has had inconsistent attendance and is progressing slowly. He is slowly increasing intensities and his MET level is increasing as well. He has reported that he feels stronger since starting the program. He says that walking is easier with decreased shortness of breath at home. He is currently exercising at 2.1 METs on the NuStep. Will continue to monitor and progress as able. patient has completed 14 exercise sessions. He has had inconsitent attendance throughout rehab. He is progressing slowly. He reports that rehab is helping him at home. He is trying to exercise at home when he has time. He has noticed a difference in his shortness of breath while doing activities. He is currenlty exercising at 1.9 METs on the NuStep. Will continue to monitor and progress as able. Pt has completed 17 exercise sessions. His progress has been limited due to his inconsistent attendance. He often  needs quite a bit of motivation while in rehab to push himselft. He does well when I give him a goal for his revolutions per minute or steps per minute while on the equipment. He is currently exercising at 2.4 METs on the stepper. Will continue to monitor and progress as able.    Expected Outcomes Through exercise at rehab and with a home exercise program, patient will achieve their goals. Through exercise at rehab and with a home exercise program, patient will achieve their goals. Through exercise at rehab and with a home exercise program, patient will achieve their goals. Through exercise at rehab  and with a home exercise program, patient will achieve their goals.             Nutrition & Weight - Outcomes:  Pre Biometrics - 11/23/20 1342       Pre Biometrics   Weight 242 lb 15.2 oz (110.2 kg)             Post Biometrics - 01/11/21 1607        Post  Biometrics   Height $Remov'5\' 9"'MhSLOY$  (1.753 m)    Weight 240 lb 11.9 oz (109.2 kg)    Waist Circumference 47 inches    Hip Circumference 46 inches    Waist to Hip Ratio 1.02 %    BMI (Calculated) 35.54    Triceps Skinfold 17 mm    % Body Fat 33.5 %    Grip Strength 37.3 kg    Flexibility 0 in    Single Leg Stand 17.37 seconds             Nutrition:  Nutrition Therapy & Goals - 09/29/20 1451       Personal Nutrition Goals   Comments Patient scored 85 on his diet assessment. We provide 2 educational sessions with handouts on heart healthy nutrition and offer RD referral.      Intervention Plan   Intervention Nutrition handout(s) given to patient.             Nutrition Discharge:  Nutrition Assessments - 01/17/21 0747       MEDFICTS Scores   Pre Score 85    Post Score 77    Score Difference -8             Education Questionnaire Score:  Knowledge Questionnaire Score - 01/17/21 0748       Knowledge Questionnaire Score   Pre Score 16/18    Post Score 15/18             Goals reviewed with patient; copy given to patient. Pt graduated from pulmonary rehab after 22 exercise sessions. His attendance was sporadic and inconsistent but did improve slightly towards the end. He gained 4.6 kg while he was in the program. He 6MWT distance was the same at orientation and at discharge. He did report that he feels less SOB with activity and thought that the program helped him. His PHQ-9 score increased from an 8 at orientation to an 11 at discharge. He does not have a good plan to continue exercise post discharge. I have encouraged him to join a gym so he can continue to exercise, but it is unclear if he will  do so.

## 2021-02-01 DIAGNOSIS — J441 Chronic obstructive pulmonary disease with (acute) exacerbation: Secondary | ICD-10-CM | POA: Diagnosis not present

## 2021-02-01 DIAGNOSIS — D500 Iron deficiency anemia secondary to blood loss (chronic): Secondary | ICD-10-CM | POA: Diagnosis not present

## 2021-02-01 DIAGNOSIS — R918 Other nonspecific abnormal finding of lung field: Secondary | ICD-10-CM | POA: Diagnosis not present

## 2021-02-10 DIAGNOSIS — D500 Iron deficiency anemia secondary to blood loss (chronic): Secondary | ICD-10-CM | POA: Diagnosis not present

## 2021-02-10 DIAGNOSIS — R918 Other nonspecific abnormal finding of lung field: Secondary | ICD-10-CM | POA: Diagnosis not present

## 2021-05-04 DIAGNOSIS — Z7189 Other specified counseling: Secondary | ICD-10-CM | POA: Diagnosis not present

## 2021-05-04 DIAGNOSIS — I10 Essential (primary) hypertension: Secondary | ICD-10-CM | POA: Diagnosis not present

## 2021-05-04 DIAGNOSIS — J449 Chronic obstructive pulmonary disease, unspecified: Secondary | ICD-10-CM | POA: Diagnosis not present

## 2021-05-04 DIAGNOSIS — Z0000 Encounter for general adult medical examination without abnormal findings: Secondary | ICD-10-CM | POA: Diagnosis not present

## 2021-05-04 DIAGNOSIS — Z79899 Other long term (current) drug therapy: Secondary | ICD-10-CM | POA: Diagnosis not present

## 2021-05-04 DIAGNOSIS — Z299 Encounter for prophylactic measures, unspecified: Secondary | ICD-10-CM | POA: Diagnosis not present

## 2021-09-21 DIAGNOSIS — J449 Chronic obstructive pulmonary disease, unspecified: Secondary | ICD-10-CM | POA: Diagnosis not present

## 2021-09-21 DIAGNOSIS — R0602 Shortness of breath: Secondary | ICD-10-CM | POA: Diagnosis not present

## 2021-09-21 DIAGNOSIS — Z299 Encounter for prophylactic measures, unspecified: Secondary | ICD-10-CM | POA: Diagnosis not present

## 2021-09-21 DIAGNOSIS — J439 Emphysema, unspecified: Secondary | ICD-10-CM | POA: Diagnosis not present

## 2021-09-21 DIAGNOSIS — I10 Essential (primary) hypertension: Secondary | ICD-10-CM | POA: Diagnosis not present

## 2021-09-21 DIAGNOSIS — Z87891 Personal history of nicotine dependence: Secondary | ICD-10-CM | POA: Diagnosis not present

## 2021-09-21 DIAGNOSIS — J45901 Unspecified asthma with (acute) exacerbation: Secondary | ICD-10-CM | POA: Diagnosis not present

## 2021-09-30 DIAGNOSIS — Z87891 Personal history of nicotine dependence: Secondary | ICD-10-CM | POA: Diagnosis not present

## 2021-09-30 DIAGNOSIS — I10 Essential (primary) hypertension: Secondary | ICD-10-CM | POA: Diagnosis not present

## 2021-09-30 DIAGNOSIS — Z299 Encounter for prophylactic measures, unspecified: Secondary | ICD-10-CM | POA: Diagnosis not present

## 2021-09-30 DIAGNOSIS — Z0000 Encounter for general adult medical examination without abnormal findings: Secondary | ICD-10-CM | POA: Diagnosis not present

## 2021-11-01 DIAGNOSIS — I10 Essential (primary) hypertension: Secondary | ICD-10-CM | POA: Diagnosis not present

## 2021-11-01 DIAGNOSIS — J449 Chronic obstructive pulmonary disease, unspecified: Secondary | ICD-10-CM | POA: Diagnosis not present

## 2021-11-01 DIAGNOSIS — Z299 Encounter for prophylactic measures, unspecified: Secondary | ICD-10-CM | POA: Diagnosis not present

## 2021-11-01 DIAGNOSIS — Z713 Dietary counseling and surveillance: Secondary | ICD-10-CM | POA: Diagnosis not present

## 2022-01-18 DIAGNOSIS — Z7189 Other specified counseling: Secondary | ICD-10-CM | POA: Diagnosis not present

## 2022-01-18 DIAGNOSIS — Z0000 Encounter for general adult medical examination without abnormal findings: Secondary | ICD-10-CM | POA: Diagnosis not present

## 2022-01-18 DIAGNOSIS — Z299 Encounter for prophylactic measures, unspecified: Secondary | ICD-10-CM | POA: Diagnosis not present

## 2022-01-18 DIAGNOSIS — I10 Essential (primary) hypertension: Secondary | ICD-10-CM | POA: Diagnosis not present

## 2022-01-18 DIAGNOSIS — Z79899 Other long term (current) drug therapy: Secondary | ICD-10-CM | POA: Diagnosis not present

## 2022-02-20 DIAGNOSIS — R911 Solitary pulmonary nodule: Secondary | ICD-10-CM | POA: Diagnosis not present

## 2022-02-20 DIAGNOSIS — R918 Other nonspecific abnormal finding of lung field: Secondary | ICD-10-CM | POA: Diagnosis not present

## 2022-02-20 DIAGNOSIS — J439 Emphysema, unspecified: Secondary | ICD-10-CM | POA: Diagnosis not present

## 2022-04-01 DIAGNOSIS — F419 Anxiety disorder, unspecified: Secondary | ICD-10-CM | POA: Diagnosis not present

## 2022-04-01 DIAGNOSIS — I10 Essential (primary) hypertension: Secondary | ICD-10-CM | POA: Diagnosis not present

## 2022-05-02 DIAGNOSIS — I10 Essential (primary) hypertension: Secondary | ICD-10-CM | POA: Diagnosis not present

## 2022-05-02 DIAGNOSIS — F419 Anxiety disorder, unspecified: Secondary | ICD-10-CM | POA: Diagnosis not present

## 2022-05-05 DIAGNOSIS — Z299 Encounter for prophylactic measures, unspecified: Secondary | ICD-10-CM | POA: Diagnosis not present

## 2022-05-05 DIAGNOSIS — N1831 Chronic kidney disease, stage 3a: Secondary | ICD-10-CM | POA: Diagnosis not present

## 2022-05-05 DIAGNOSIS — R972 Elevated prostate specific antigen [PSA]: Secondary | ICD-10-CM | POA: Diagnosis not present

## 2022-05-05 DIAGNOSIS — J449 Chronic obstructive pulmonary disease, unspecified: Secondary | ICD-10-CM | POA: Diagnosis not present

## 2022-05-05 DIAGNOSIS — Z6833 Body mass index (BMI) 33.0-33.9, adult: Secondary | ICD-10-CM | POA: Diagnosis not present

## 2022-05-05 DIAGNOSIS — I10 Essential (primary) hypertension: Secondary | ICD-10-CM | POA: Diagnosis not present

## 2022-05-10 DIAGNOSIS — R972 Elevated prostate specific antigen [PSA]: Secondary | ICD-10-CM | POA: Diagnosis not present

## 2022-05-10 DIAGNOSIS — N1831 Chronic kidney disease, stage 3a: Secondary | ICD-10-CM | POA: Diagnosis not present

## 2022-09-21 DIAGNOSIS — J449 Chronic obstructive pulmonary disease, unspecified: Secondary | ICD-10-CM | POA: Diagnosis not present

## 2022-09-21 DIAGNOSIS — D500 Iron deficiency anemia secondary to blood loss (chronic): Secondary | ICD-10-CM | POA: Diagnosis not present

## 2022-09-21 DIAGNOSIS — R918 Other nonspecific abnormal finding of lung field: Secondary | ICD-10-CM | POA: Diagnosis not present

## 2023-01-10 ENCOUNTER — Encounter: Payer: Self-pay | Admitting: Gastroenterology

## 2023-02-02 DIAGNOSIS — F419 Anxiety disorder, unspecified: Secondary | ICD-10-CM | POA: Diagnosis not present

## 2023-02-02 DIAGNOSIS — Z1339 Encounter for screening examination for other mental health and behavioral disorders: Secondary | ICD-10-CM | POA: Diagnosis not present

## 2023-02-02 DIAGNOSIS — Z7189 Other specified counseling: Secondary | ICD-10-CM | POA: Diagnosis not present

## 2023-02-02 DIAGNOSIS — R5383 Other fatigue: Secondary | ICD-10-CM | POA: Diagnosis not present

## 2023-02-02 DIAGNOSIS — E7800 Pure hypercholesterolemia, unspecified: Secondary | ICD-10-CM | POA: Diagnosis not present

## 2023-02-02 DIAGNOSIS — I10 Essential (primary) hypertension: Secondary | ICD-10-CM | POA: Diagnosis not present

## 2023-02-02 DIAGNOSIS — Z0000 Encounter for general adult medical examination without abnormal findings: Secondary | ICD-10-CM | POA: Diagnosis not present

## 2023-02-02 DIAGNOSIS — Z299 Encounter for prophylactic measures, unspecified: Secondary | ICD-10-CM | POA: Diagnosis not present

## 2023-02-02 DIAGNOSIS — Z1331 Encounter for screening for depression: Secondary | ICD-10-CM | POA: Diagnosis not present

## 2023-02-08 DIAGNOSIS — E7800 Pure hypercholesterolemia, unspecified: Secondary | ICD-10-CM | POA: Diagnosis not present

## 2023-02-08 DIAGNOSIS — F419 Anxiety disorder, unspecified: Secondary | ICD-10-CM | POA: Diagnosis not present

## 2023-02-08 DIAGNOSIS — Z125 Encounter for screening for malignant neoplasm of prostate: Secondary | ICD-10-CM | POA: Diagnosis not present

## 2023-02-08 DIAGNOSIS — R5383 Other fatigue: Secondary | ICD-10-CM | POA: Diagnosis not present

## 2023-02-08 DIAGNOSIS — Z79899 Other long term (current) drug therapy: Secondary | ICD-10-CM | POA: Diagnosis not present

## 2023-02-15 DIAGNOSIS — N1831 Chronic kidney disease, stage 3a: Secondary | ICD-10-CM | POA: Diagnosis not present

## 2023-02-15 DIAGNOSIS — R972 Elevated prostate specific antigen [PSA]: Secondary | ICD-10-CM | POA: Diagnosis not present

## 2023-02-15 DIAGNOSIS — Z299 Encounter for prophylactic measures, unspecified: Secondary | ICD-10-CM | POA: Diagnosis not present

## 2023-02-15 DIAGNOSIS — I10 Essential (primary) hypertension: Secondary | ICD-10-CM | POA: Diagnosis not present

## 2023-03-27 ENCOUNTER — Ambulatory Visit: Payer: No Typology Code available for payment source | Admitting: Gastroenterology

## 2023-04-05 ENCOUNTER — Ambulatory Visit: Payer: No Typology Code available for payment source | Admitting: Internal Medicine

## 2023-04-05 ENCOUNTER — Encounter: Payer: Self-pay | Admitting: Internal Medicine

## 2023-04-05 VITALS — BP 142/81 | HR 74 | Temp 98.6°F | Ht 69.0 in | Wt 224.6 lb

## 2023-04-05 DIAGNOSIS — K219 Gastro-esophageal reflux disease without esophagitis: Secondary | ICD-10-CM

## 2023-04-05 DIAGNOSIS — Z23 Encounter for immunization: Secondary | ICD-10-CM | POA: Diagnosis not present

## 2023-04-05 DIAGNOSIS — K449 Diaphragmatic hernia without obstruction or gangrene: Secondary | ICD-10-CM | POA: Diagnosis not present

## 2023-04-05 DIAGNOSIS — Z299 Encounter for prophylactic measures, unspecified: Secondary | ICD-10-CM | POA: Diagnosis not present

## 2023-04-05 DIAGNOSIS — I10 Essential (primary) hypertension: Secondary | ICD-10-CM | POA: Diagnosis not present

## 2023-04-05 DIAGNOSIS — J449 Chronic obstructive pulmonary disease, unspecified: Secondary | ICD-10-CM | POA: Diagnosis not present

## 2023-04-05 NOTE — Progress Notes (Signed)
Primary Care Physician:  Sherwood Gambler, MD Primary Gastroenterologist:  Dr. Marletta Lor  Chief Complaint  Patient presents with   New Patient (Initial Visit)    Referred for GERD. Pt states he has been dealing with it for years    HPI:   Wesley Daugherty is a 77 y.o. male who presents to clinic today by referral from his PCP Dr. Jess Barters for chronic GERD.  Patient states he has had reflux for over 10 years.  Currently taking omeprazole 20 mg daily.  States his symptoms are relatively well-controlled.  No dysphagia odynophagia.  No epigastric or chest pain.  Personally reviewed records of prior endoscopic procedures.  Upper endoscopy 12/19/2019 hiatal hernia, gastritis, otherwise unremarkable.  No mention of Barrett's esophagus.  Colonoscopy 12/19/2019 diverticulosis, otherwise unremarkable.  Past Medical History:  Diagnosis Date   Asthma     History reviewed. No pertinent surgical history.  Current Outpatient Medications  Medication Sig Dispense Refill   amLODipine (NORVASC) 2.5 MG tablet Take 5 mg by mouth daily.     atorvastatin (LIPITOR) 10 MG tablet Take 10 mg by mouth daily.     buPROPion (WELLBUTRIN XL) 150 MG 24 hr tablet Take 150 mg by mouth daily.     Cholecalciferol (VITAMIN D3) 250 MCG (10000 UT) capsule Take 10,000 Units by mouth daily.     Ergocalciferol (VITAMIN D2) 50 MCG (2000 UT) TABS Take 1 tablet by mouth daily.     hydrochlorothiazide (HYDRODIURIL) 25 MG tablet Take 25 mg by mouth daily.     metoprolol tartrate (LOPRESSOR) 50 MG tablet      omeprazole (PRILOSEC) 20 MG capsule Take 20 mg by mouth daily.     pravastatin (PRAVACHOL) 80 MG tablet Take 80 mg by mouth daily.     albuterol (PROVENTIL HFA;VENTOLIN HFA) 108 (90 Base) MCG/ACT inhaler Inhale 2 puffs into the lungs every 6 (six) hours as needed for wheezing or shortness of breath. (Patient not taking: Reported on 04/05/2023)     albuterol (PROVENTIL) (5 MG/ML) 0.5% nebulizer solution Take 2.5 mg by  nebulization every 6 (six) hours as needed for wheezing or shortness of breath. (Patient not taking: Reported on 04/05/2023)     budesonide-formoterol (SYMBICORT) 160-4.5 MCG/ACT inhaler Inhale 2 puffs into the lungs 2 (two) times daily. (Patient not taking: Reported on 04/05/2023)     furosemide (LASIX) 20 MG tablet Take 20 mg by mouth daily. (Patient not taking: Reported on 04/05/2023)     mirtazapine (REMERON) 15 MG tablet Take 15 mg by mouth at bedtime.     naproxen (NAPROSYN) 500 MG tablet Take 500 mg by mouth 2 (two) times daily with a meal. (Patient not taking: Reported on 09/14/2020)     OXcarbazepine (TRILEPTAL) 150 MG tablet Take 150 mg by mouth 2 (two) times daily. (Patient not taking: Reported on 04/05/2023)     sildenafil (VIAGRA) 100 MG tablet Take 100 mg by mouth as needed for erectile dysfunction. (Patient not taking: Reported on 04/05/2023)     tiotropium (SPIRIVA) 18 MCG inhalation capsule Place 18 mcg into inhaler and inhale daily. (Patient not taking: Reported on 09/14/2020)     No current facility-administered medications for this visit.    Allergies as of 04/05/2023   (No Known Allergies)    History reviewed. No pertinent family history.  Social History   Socioeconomic History   Marital status: Single    Spouse name: Not on file   Number of children: 1   Years of  education: Not on file   Highest education level: High school graduate  Occupational History   Occupation: Patent attorney: [employer]    Comment: retired in 2005  Tobacco Use   Smoking status: Former    Current packs/day: 0.00    Average packs/day: 0.5 packs/day for 24.0 years (12.0 ttl pk-yrs)    Types: Cigarettes    Start date: 81    Quit date: 1998    Years since quitting: 26.7   Smokeless tobacco: Never  Substance and Sexual Activity   Alcohol use: Not on file   Drug use: Not on file   Sexual activity: Not on file  Other Topics Concern   Not on file  Social History Narrative   Not on  file   Social Determinants of Health   Financial Resource Strain: Not on file  Food Insecurity: Not on file  Transportation Needs: Not on file  Physical Activity: Not on file  Stress: Not on file  Social Connections: Not on file  Intimate Partner Violence: Not on file    Subjective: Review of Systems  Constitutional:  Negative for chills and fever.  HENT:  Negative for congestion and hearing loss.   Eyes:  Negative for blurred vision and double vision.  Respiratory:  Negative for cough and shortness of breath.   Cardiovascular:  Negative for chest pain and palpitations.  Gastrointestinal:  Positive for heartburn. Negative for abdominal pain, blood in stool, constipation, diarrhea, melena and vomiting.  Genitourinary:  Negative for dysuria and urgency.  Musculoskeletal:  Negative for joint pain and myalgias.  Skin:  Negative for itching and rash.  Neurological:  Negative for dizziness and headaches.  Psychiatric/Behavioral:  Negative for depression. The patient is not nervous/anxious.        Objective: BP (!) 142/81   Pulse 74   Temp 98.6 F (37 C)   Ht 5\' 9"  (1.753 m)   Wt 224 lb 9.6 oz (101.9 kg)   BMI 33.17 kg/m  Physical Exam Constitutional:      Appearance: Normal appearance.  HENT:     Head: Normocephalic and atraumatic.  Eyes:     Extraocular Movements: Extraocular movements intact.     Conjunctiva/sclera: Conjunctivae normal.  Cardiovascular:     Rate and Rhythm: Normal rate and regular rhythm.  Pulmonary:     Effort: Pulmonary effort is normal.     Breath sounds: Normal breath sounds.  Abdominal:     General: Bowel sounds are normal.     Palpations: Abdomen is soft.  Musculoskeletal:        General: Normal range of motion.     Cervical back: Normal range of motion and neck supple.  Skin:    General: Skin is warm.  Neurological:     General: No focal deficit present.     Mental Status: He is alert and oriented to person, place, and time.   Psychiatric:        Mood and Affect: Mood normal.        Behavior: Behavior normal.      Assessment: *Chronic GERD-well-controlled on omeprazole daily *Hiatal hernia *Screening for Barrett's esophagus  Plan: Will schedule for EGD to evaluate patient's chronic GERD as well as screen him for Barrett's esophagus.  The risks including infection, bleed, or perforation as well as benefits, limitations, alternatives and imponderables have been reviewed with the patient. Potential for esophageal dilation, biopsy, etc. have also been reviewed.  Questions have been answered. All parties agreeable.  Continue omeprazole daily.  Thank you Dr. Jess Barters for the kind referral  04/05/2023 10:03 AM   Disclaimer: This note was dictated with voice recognition software. Similar sounding words can inadvertently be transcribed and may not be corrected upon review.

## 2023-04-05 NOTE — Patient Instructions (Signed)
We will schedule you for upper endoscopy to further evaluate your chronic acid reflux, as well as screening for Barrett's esophagus.  Continue on omeprazole daily.  It was very nice meeting you today.  Dr. Marletta Lor

## 2023-04-09 DIAGNOSIS — I10 Essential (primary) hypertension: Secondary | ICD-10-CM | POA: Diagnosis not present

## 2023-04-09 DIAGNOSIS — I471 Supraventricular tachycardia, unspecified: Secondary | ICD-10-CM | POA: Diagnosis not present

## 2023-04-09 DIAGNOSIS — Z299 Encounter for prophylactic measures, unspecified: Secondary | ICD-10-CM | POA: Diagnosis not present

## 2023-04-09 DIAGNOSIS — J449 Chronic obstructive pulmonary disease, unspecified: Secondary | ICD-10-CM | POA: Diagnosis not present

## 2023-04-09 DIAGNOSIS — R972 Elevated prostate specific antigen [PSA]: Secondary | ICD-10-CM | POA: Diagnosis not present

## 2023-04-09 DIAGNOSIS — Z0000 Encounter for general adult medical examination without abnormal findings: Secondary | ICD-10-CM | POA: Diagnosis not present

## 2023-04-11 ENCOUNTER — Telehealth: Payer: Self-pay | Admitting: *Deleted

## 2023-04-11 NOTE — Telephone Encounter (Signed)
LMOVM to call back to schedule EGD with propofol asa 3, Dr. Marletta Lor

## 2023-04-12 ENCOUNTER — Telehealth: Payer: Self-pay | Admitting: Internal Medicine

## 2023-04-12 NOTE — Telephone Encounter (Signed)
Patient returned your call... left message at front desk

## 2023-04-12 NOTE — Telephone Encounter (Signed)
Tried calling pt back, no answer and not able to leave VM

## 2023-04-13 ENCOUNTER — Encounter: Payer: Self-pay | Admitting: *Deleted

## 2023-04-13 NOTE — Telephone Encounter (Signed)
[employer] PA: Approved Authorization #[id number]  Tracking #[id number] Dates of service 05/15/2023 - 08/15/2023

## 2023-04-13 NOTE — Telephone Encounter (Signed)
Pt has been scheduled for 05/15/23. Instructions mailed

## 2023-04-16 ENCOUNTER — Encounter: Payer: Self-pay | Admitting: *Deleted

## 2023-05-10 NOTE — Patient Instructions (Signed)
Wesley Daugherty  05/10/2023     @[email or web address] @   Your procedure is scheduled on  05/15/2023.   Report to [facility] at  1000  A.M.   Call this number if you have problems the morning of surgery:  [phone]  If you experience any cold or flu symptoms such as cough, fever, chills, shortness of breath, etc. between now and your scheduled surgery, please notify us at the above number.   Remember:  Follow the diet instructions given to you by the office.    You may drink clear liquids until 1000 am on 05/15/2023.    Clear liquids allowed are:                    Water, Juice (No red color; non-citric and without pulp; diabetics please choose diet or no sugar options), Carbonated beverages (diabetics please choose diet or no sugar options), Clear Tea (No creamer, milk, or cream, including half & half and powdered creamer), Black Coffee Only (No creamer, milk or cream, including half & half and powdered creamer), and Clear Sports drink (No red color; diabetics please choose diet or no sugar options)    Take these medicines the morning of surgery with A SIP OF WATER           amlodipine, bupropion, metoprolol, omeprazole.     Do not wear jewelry, make-up or nail polish, including gel polish,  artificial nails, or any other type of covering on natural nails (fingers and  toes).  Do not wear lotions, powders, or perfumes, or deodorant.  Do not shave 48 hours prior to surgery.  Men may shave face and neck.  Do not bring valuables to the hospital.  [facility] is not responsible for any belongings or valuables.  Contacts, dentures or bridgework may not be worn into surgery.  Leave your suitcase in the car.  After surgery it may be brought to your room.  For patients admitted to the hospital, discharge time will be determined by your treatment team.  Patients discharged the day of surgery will not be allowed to drive home and must have someone with them for 24 hours.     Special instructions:   DO NOT smoke tobacco or vape for 24 hours before your procedure.  Please read over the following fact sheets that you were given. Anesthesia Post-op Instructions and Care and Recovery After Surgery      Upper Endoscopy, Adult, Care After After the procedure, it is common to have a sore throat. It is also common to have: Mild stomach pain or discomfort. Bloating. Nausea. Follow these instructions at home: The instructions below may help you care for yourself at home. Your health care provider may give you more instructions. If you have questions, ask your health care provider. If you were given a sedative during the procedure, it can affect you for several hours. Do not drive or operate machinery until your health care provider says that it is safe. If you will be going home right after the procedure, plan to have a responsible adult: Take you home from the hospital or clinic. You will not be allowed to drive. Care for you for the time you are told. Follow instructions from your health care provider about what you may eat and drink. Return to your normal activities as told by your health care provider. Ask your health care provider what activities are safe for you. Take over-the-counter and prescription  medicines only as told by your health care provider. Contact a health care provider if you: Have a sore throat that lasts longer than one day. Have trouble swallowing. Have a fever. Get help right away if you: Vomit blood or your vomit looks like coffee grounds. Have bloody, black, or tarry stools. Have a very bad sore throat or you cannot swallow. Have difficulty breathing or very bad pain in your chest or abdomen. These symptoms may be an emergency. Get help right away. Call 911. Do not wait to see if the symptoms will go away. Do not drive yourself to the hospital. Summary After the procedure, it is common to have a sore throat, mild stomach  discomfort, bloating, and nausea. If you were given a sedative during the procedure, it can affect you for several hours. Do not drive until your health care provider says that it is safe. Follow instructions from your health care provider about what you may eat and drink. Return to your normal activities as told by your health care provider. This information is not intended to replace advice given to you by your health care provider. Make sure you discuss any questions you have with your health care provider. Document Revised: 09/28/2021 Document Reviewed: 09/28/2021 Elsevier Patient Education  2024 [employer] Monitored Anesthesia Care, Care After The following information offers guidance on how to care for yourself after your procedure. Your health care provider may also give you more specific instructions. If you have problems or questions, contact your health care provider. What can I expect after the procedure? After the procedure, it is common to have: Tiredness. Little or no memory about what happened during or after the procedure. Impaired judgment when it comes to making decisions. Nausea or vomiting. Some trouble with balance. Follow these instructions at home: For the time period you were told by your health care provider:  Rest. Do not participate in activities where you could fall or become injured. Do not drive or use machinery. Do not drink alcohol. Do not take sleeping pills or medicines that cause drowsiness. Do not make important decisions or sign legal documents. Do not take care of children on your own. Medicines Take over-the-counter and prescription medicines only as told by your health care provider. If you were prescribed antibiotics, take them as told by your health care provider. Do not stop using the antibiotic even if you start to feel better. Eating and drinking Follow instructions from your health care provider about what you may eat and drink. Drink  enough fluid to keep your urine pale yellow. If you vomit: Drink clear fluids slowly and in small amounts as you are able. Clear fluids include water, ice chips, low-calorie sports drinks, and fruit juice that has water added to it (diluted fruit juice). Eat light and bland foods in small amounts as you are able. These foods include bananas, applesauce, rice, lean meats, toast, and crackers. General instructions  Have a responsible adult stay with you for the time you are told. It is important to have someone help care for you until you are awake and alert. If you have sleep apnea, surgery and some medicines can increase your risk for breathing problems. Follow instructions from your health care provider about wearing your sleep device: When you are sleeping. This includes during daytime naps. While taking prescription pain medicines, sleeping medicines, or medicines that make you drowsy. Do not use any products that contain nicotine or tobacco. These products include cigarettes, chewing tobacco, and  vaping devices, such as e-cigarettes. If you need help quitting, ask your health care provider. Contact a health care provider if: You feel nauseous or vomit every time you eat or drink. You feel light-headed. You are still sleepy or having trouble with balance after 24 hours. You get a rash. You have a fever. You have redness or swelling around the IV site. Get help right away if: You have trouble breathing. You have new confusion after you get home. These symptoms may be an emergency. Get help right away. Call 911. Do not wait to see if the symptoms will go away. Do not drive yourself to the hospital. This information is not intended to replace advice given to you by your health care provider. Make sure you discuss any questions you have with your health care provider. Document Revised: 11/14/2021 Document Reviewed: 11/14/2021 Elsevier Patient Education  2024 [employer].

## 2023-05-11 ENCOUNTER — Encounter (HOSPITAL_COMMUNITY): Payer: Self-pay

## 2023-05-11 ENCOUNTER — Encounter (HOSPITAL_COMMUNITY)
Admission: RE | Admit: 2023-05-11 | Discharge: 2023-05-11 | Disposition: A | Discharge status: Home / Self Care | Payer: No Typology Code available for payment source | Source: Ambulatory Visit | Attending: Internal Medicine | Admitting: Internal Medicine

## 2023-05-11 VITALS — BP 128/73 | HR 73 | Temp 97.7°F | Resp 18 | Ht 69.0 in | Wt 224.6 lb

## 2023-05-11 DIAGNOSIS — Z01818 Encounter for other preprocedural examination: Secondary | ICD-10-CM | POA: Insufficient documentation

## 2023-05-11 DIAGNOSIS — I10 Essential (primary) hypertension: Secondary | ICD-10-CM | POA: Insufficient documentation

## 2023-05-11 DIAGNOSIS — Z79899 Other long term (current) drug therapy: Secondary | ICD-10-CM | POA: Insufficient documentation

## 2023-05-11 DIAGNOSIS — R9431 Abnormal electrocardiogram [ECG] [EKG]: Secondary | ICD-10-CM | POA: Insufficient documentation

## 2023-05-11 LAB — BASIC METABOLIC PANEL
Anion gap: 8 (ref 5–15)
BUN: 12 mg/dL (ref 8–23)
CO2: 23 mmol/L (ref 22–32)
Calcium: 9.1 mg/dL (ref 8.9–10.3)
Chloride: 105 mmol/L (ref 98–111)
Creatinine, Ser: 1.26 mg/dL — ABNORMAL HIGH (ref 0.61–1.24)
GFR, Estimated: 60 mL/min — ABNORMAL LOW (ref >60)
Glucose, Bld: 98 mg/dL (ref 70–99)
Potassium: 3.9 mmol/L (ref 3.5–5.1)
Sodium: 136 mmol/L (ref 135–145)

## 2023-05-15 ENCOUNTER — Ambulatory Visit (HOSPITAL_COMMUNITY): Payer: No Typology Code available for payment source | Admitting: Anesthesiology

## 2023-05-15 ENCOUNTER — Encounter (HOSPITAL_COMMUNITY): Payer: Self-pay

## 2023-05-15 ENCOUNTER — Encounter (HOSPITAL_COMMUNITY)
Admission: RE | Disposition: A | Discharge status: Home / Self Care | Payer: Self-pay | Source: Home / Self Care | Attending: Internal Medicine

## 2023-05-15 ENCOUNTER — Ambulatory Visit (HOSPITAL_COMMUNITY)
Admission: RE | Admit: 2023-05-15 | Discharge: 2023-05-15 | Disposition: A | Discharge status: Home / Self Care | Payer: No Typology Code available for payment source | Attending: Internal Medicine | Admitting: Internal Medicine

## 2023-05-15 DIAGNOSIS — J449 Chronic obstructive pulmonary disease, unspecified: Secondary | ICD-10-CM

## 2023-05-15 DIAGNOSIS — K449 Diaphragmatic hernia without obstruction or gangrene: Secondary | ICD-10-CM | POA: Diagnosis not present

## 2023-05-15 DIAGNOSIS — K2270 Barrett's esophagus without dysplasia: Secondary | ICD-10-CM | POA: Diagnosis not present

## 2023-05-15 DIAGNOSIS — Z87891 Personal history of nicotine dependence: Secondary | ICD-10-CM | POA: Diagnosis not present

## 2023-05-15 DIAGNOSIS — J4489 Other specified chronic obstructive pulmonary disease: Secondary | ICD-10-CM | POA: Insufficient documentation

## 2023-05-15 DIAGNOSIS — K31A0 Gastric intestinal metaplasia, unspecified: Secondary | ICD-10-CM | POA: Diagnosis not present

## 2023-05-15 DIAGNOSIS — K2289 Other specified disease of esophagus: Secondary | ICD-10-CM

## 2023-05-15 DIAGNOSIS — Z13810 Encounter for screening for upper gastrointestinal disorder: Secondary | ICD-10-CM | POA: Insufficient documentation

## 2023-05-15 DIAGNOSIS — K31A11 Gastric intestinal metaplasia without dysplasia, involving the antrum: Secondary | ICD-10-CM | POA: Diagnosis not present

## 2023-05-15 DIAGNOSIS — J439 Emphysema, unspecified: Secondary | ICD-10-CM | POA: Diagnosis not present

## 2023-05-15 DIAGNOSIS — K2970 Gastritis, unspecified, without bleeding: Secondary | ICD-10-CM | POA: Diagnosis not present

## 2023-05-15 DIAGNOSIS — K219 Gastro-esophageal reflux disease without esophagitis: Secondary | ICD-10-CM | POA: Insufficient documentation

## 2023-05-15 HISTORY — PX: BIOPSY: SHX5522

## 2023-05-15 HISTORY — PX: ESOPHAGOGASTRODUODENOSCOPY (EGD) WITH PROPOFOL: SHX5813

## 2023-05-15 SURGERY — ESOPHAGOGASTRODUODENOSCOPY (EGD) WITH PROPOFOL
Anesthesia: General

## 2023-05-15 MED ORDER — PROPOFOL 500 MG/50ML IV EMUL
INTRAVENOUS | Status: DC | PRN
  Administered 2023-05-15 (×2): 50 mg via INTRAVENOUS
  Administered 2023-05-15: 150 ug/kg/min via INTRAVENOUS
  Administered 2023-05-15: 50 mg via INTRAVENOUS

## 2023-05-15 MED ORDER — LIDOCAINE HCL (CARDIAC) PF 100 MG/5ML IV SOSY
PREFILLED_SYRINGE | INTRAVENOUS | Status: DC | PRN
  Administered 2023-05-15: 40 mg via INTRAVENOUS

## 2023-05-15 MED ORDER — LACTATED RINGERS IV SOLN: INTRAVENOUS | Status: DC | PRN

## 2023-05-15 NOTE — Discharge Instructions (Signed)
EGD Discharge instructions Please read the instructions outlined below and refer to this sheet in the next few weeks. These discharge instructions provide you with general information on caring for yourself after you leave the hospital. Your doctor may also give you specific instructions. While your treatment has been planned according to the most current medical practices available, unavoidable complications occasionally occur. If you have any problems or questions after discharge, please call your doctor. ACTIVITY You may resume your regular activity but move at a slower pace for the next 24 hours.  Take frequent rest periods for the next 24 hours.  Walking will help expel (get rid of) the air and reduce the bloated feeling in your abdomen.  No driving for 24 hours (because of the anesthesia (medicine) used during the test).  You may shower.  Do not sign any important legal documents or operate any machinery for 24 hours (because of the anesthesia used during the test).  NUTRITION Drink plenty of fluids.  You may resume your normal diet.  Begin with a light meal and progress to your normal diet.  Avoid alcoholic beverages for 24 hours or as instructed by your caregiver.  MEDICATIONS You may resume your normal medications unless your caregiver tells you otherwise.  WHAT YOU CAN EXPECT TODAY You may experience abdominal discomfort such as a feeling of fullness or "gas" pains.  FOLLOW-UP Your doctor will discuss the results of your test with you.  SEEK IMMEDIATE MEDICAL ATTENTION IF ANY OF THE FOLLOWING OCCUR: Excessive nausea (feeling sick to your stomach) and/or vomiting.  Severe abdominal pain and distention (swelling).  Trouble swallowing.  Temperature over 101 F (37.8 C).  Rectal bleeding or vomiting of blood.    Your EGD revealed mild amount inflammation in your stomach.  I took biopsies of this to rule out infection with a bacteria called H. pylori.    You also have a medium  sized hiatal hernia. I took samples of your esophagus to screen for Barrett's esophagus.   Await pathology results, my office will contact you.  Continue on Omeprazole. Follow up in Gi office in 2-3 months.   I hope you have a great rest of your week!  Hennie Duos. Marletta Lor, D.O. Gastroenterology and Hepatology [facility]

## 2023-05-15 NOTE — Op Note (Signed)
[facility] Patient Name: Wesley Daugherty Procedure Date: 05/15/2023 10:49 AM MRN: [id number] Date of Birth: 1946/01/28 Attending MD: Hennie Duos. Marletta Lor , [place], [id number] CSN: [id number] Age: 77 Admit Type: Outpatient Procedure:                Upper GI endoscopy Indications:              Screening procedure, Screening for Barrett's                            esophagus in patient at risk for this condition Providers:                Hennie Duos. Marletta Lor, DO, Angelica Ran, Elinor Parkinson Referring MD:              Medicines:                See the Anesthesia note for documentation of the                            administered medications Complications:            No immediate complications. Estimated Blood Loss:     Estimated blood loss was minimal. Procedure:                Pre-Anesthesia Assessment:                           - The anesthesia plan was to use monitored                            anesthesia care (MAC).                           After obtaining informed consent, the endoscope was                            passed under direct vision. Throughout the                            procedure, the patient's blood pressure, pulse, and                            oxygen saturations were monitored continuously. The                            [id number]-H190 ([id number]) scope was introduced through the                            mouth, and advanced to the second part of duodenum.                            The upper GI endoscopy was accomplished without                            difficulty. The patient tolerated the procedure  well. Scope In: 11:03:36 AM Scope Out: 11:08:31 AM Total Procedure Duration: 0 hours 4 minutes 55 seconds  Findings:      A 4 cm hiatal hernia was present.      The Z-line was irregular. Biopsies were taken with a cold forceps for       histology.      Patchy mild inflammation characterized by erythema was found in the       gastric body  and in the gastric antrum. Biopsies were taken with a cold       forceps for Helicobacter pylori testing.      The duodenal bulb, first portion of the duodenum and second portion of       the duodenum were normal. Impression:               - 4 cm hiatal hernia.                           - Z-line irregular. Biopsied.                           - Gastritis. Biopsied.                           - Normal duodenal bulb, first portion of the                            duodenum and second portion of the duodenum. Moderate Sedation:      Per Anesthesia Care Recommendation:           - Patient has a contact number available for                            emergencies. The signs and symptoms of potential                            delayed complications were discussed with the                            patient. Return to normal activities tomorrow.                            Written discharge instructions were provided to the                            patient.                           - Resume previous diet.                           - Continue present medications.                           - Await pathology results.                           - Return to GI clinic in 3 months. Procedure Code(s):        ---  Professional ---                           [phone], Esophagogastroduodenoscopy, flexible,                            transoral; with biopsy, single or multiple Diagnosis Code(s):        --- Professional ---                           K44.9, Diaphragmatic hernia without obstruction or                            gangrene                           K22.89, Other specified disease of esophagus                           K29.70, Gastritis, unspecified, without bleeding                           Z13.810, Encounter for screening for upper                            gastrointestinal disorder CPT copyright 2022 American Medical Association. All rights reserved. The codes documented in this report are  preliminary and upon coder review may  be revised to meet current compliance requirements. Hennie Duos. Marletta Lor, DO Hennie Duos. Marletta Lor, DO 05/15/2023 11:11:04 AM This report has been signed electronically. Number of Addenda: 0

## 2023-05-15 NOTE — H&P (Signed)
Primary Care Physician:  Sherwood Gambler, MD Primary Gastroenterologist:  Dr. Marletta Lor  Pre-Procedure History & Physical: HPI:  Wesley Daugherty is a 77 y.o. male is here for an EGD to be performed for chronic GERD, screening for Barrett's esophagus  Past Medical History:  Diagnosis Date   Asthma    Dyslipidemia    Emphysema lung (HCC)    GERD (gastroesophageal reflux disease)     Past Surgical History:  Procedure Laterality Date   COLONOSCOPY      Prior to Admission medications   Medication Sig Start Date End Date Taking? Authorizing Provider  amLODipine (NORVASC) 2.5 MG tablet Take 5 mg by mouth daily. 03/07/20   [provider]  atorvastatin (LIPITOR) 10 MG tablet Take 10 mg by mouth daily.    [provider]  buPROPion (WELLBUTRIN XL) 150 MG 24 hr tablet Take 150 mg by mouth daily.    [provider]  Cholecalciferol (VITAMIN D3) 250 MCG (10000 UT) capsule Take 10,000 Units by mouth daily.    [provider]  Ergocalciferol (VITAMIN D2) 50 MCG (2000 UT) TABS Take 1 tablet by mouth daily.    [provider]  hydrochlorothiazide (HYDRODIURIL) 25 MG tablet Take 25 mg by mouth daily.    [provider]  metoprolol tartrate (LOPRESSOR) 50 MG tablet     [provider]  mirtazapine (REMERON) 15 MG tablet Take 15 mg by mouth at bedtime.    [provider]  omeprazole (PRILOSEC) 20 MG capsule Take 20 mg by mouth daily.    [provider]  pravastatin (PRAVACHOL) 80 MG tablet Take 80 mg by mouth daily.    [provider]    Allergies as of 04/13/2023   (No Known Allergies)    Family History  Problem Relation Age of Onset   Colon cancer Neg Hx     Social History   Socioeconomic History   Marital status: Single    Spouse name: Not on file   Number of children: 1   Years of education: Not on file   Highest education level: High school graduate  Occupational History   Occupation: Engineer, manufacturing: [employer]    Comment: retired in 2005  Tobacco Use   Smoking status: Former    Current packs/day: 0.00    Average packs/day: 0.5 packs/day for 24.0 years (12.0 ttl pk-yrs)    Types: Cigarettes    Start date: 43    Quit date: 1998    Years since quitting: 26.8   Smokeless tobacco: Never  Substance and Sexual Activity   Alcohol use: Yes   Drug use: Never   Sexual activity: Not on file  Other Topics Concern   Not on file  Social History Narrative   Not on file   Social Determinants of Health   Financial Resource Strain: Not on file  Food Insecurity: Not on file  Transportation Needs: Not on file  Physical Activity: Not on file  Stress: Not on file  Social Connections: Not on file  Intimate Partner Violence: Not on file    Review of Systems: General: Negative for fever, chills, fatigue, weakness. Eyes: Negative for vision changes.  ENT: Negative for hoarseness, difficulty swallowing , nasal congestion. CV: Negative for chest pain, angina, palpitations, dyspnea on exertion, peripheral edema.  Respiratory: Negative for dyspnea at rest, dyspnea on exertion, cough, sputum, wheezing.  GI: See history of present illness. GU:  Negative for dysuria, hematuria, urinary incontinence, urinary  frequency, nocturnal urination.  MS: Negative for joint pain, low back pain.  Derm: Negative for rash or itching.  Neuro: Negative for weakness, abnormal sensation, seizure, frequent headaches, memory loss, confusion.  Psych: Negative for anxiety, depression Endo: Negative for unusual weight change.  Heme: Negative for bruising or bleeding. Allergy: Negative for rash or hives.  Physical Exam: Vital signs in last 24 hours:     General:   Alert,  Well-developed, well-nourished, pleasant and cooperative in NAD Head:  Normocephalic and atraumatic. Eyes:  Sclera clear, no icterus.   Conjunctiva pink. Ears:  Normal auditory acuity. Nose:  No deformity, discharge,  or  lesions. Msk:  Symmetrical without gross deformities. Normal posture. Extremities:  Without clubbing or edema. Neurologic:  Alert and  oriented x4;  grossly normal neurologically. Skin:  Intact without significant lesions or rashes. Psych:  Alert and cooperative. Normal mood and affect.   Impression/Plan: Wesley Daugherty is here for an EGD to be performed for chronic GERD, screening for Barrett's esophagus  Risks, benefits, limitations, imponderables and alternatives regarding procedure have been reviewed with the patient. Questions have been answered. All parties agreeable.

## 2023-05-15 NOTE — Transfer of Care (Signed)
Immediate Anesthesia Transfer of Care Note  Patient: Wesley Daugherty  Procedure(s) Performed: ESOPHAGOGASTRODUODENOSCOPY (EGD) WITH PROPOFOL BIOPSY  Patient Location: PACU and Short Stay  Anesthesia Type:General  Level of Consciousness: drowsy  Airway & Oxygen Therapy: Patient Spontanous Breathing  Post-op Assessment: Report given to RN and Post -op Vital signs reviewed and stable  Post vital signs: Reviewed  Last Vitals:  Vitals Value Taken Time  BP 108/62 05/15/23 1113  Temp 36.4 C 05/15/23 1113  Pulse 65 05/15/23 1113  Resp 24 05/15/23 1113  SpO2 100 % 05/15/23 1113    Last Pain:  Vitals:   05/15/23 1113  TempSrc: Oral  PainSc: Asleep         Complications: No notable events documented.

## 2023-05-15 NOTE — Anesthesia Preprocedure Evaluation (Addendum)
Anesthesia Evaluation  Patient identified by MRN, date of birth, ID band Patient awake    Reviewed: Allergy & Precautions, H&P , NPO status , Patient's Chart, lab work & pertinent test results, reviewed documented beta blocker date and time   Airway Mallampati: II  TM Distance: >3 FB Neck ROM: full    Dental no notable dental hx. (+) Dental Advisory Given, Teeth Intact   Pulmonary asthma , COPD, former smoker Empysema, Stop bang 4   Pulmonary exam normal breath sounds clear to auscultation       Cardiovascular Exercise Tolerance: Good negative cardio ROS Normal cardiovascular exam Rhythm:regular Rate:Normal     Neuro/Psych negative neurological ROS  negative psych ROS   GI/Hepatic Neg liver ROS,GERD  ,,  Endo/Other  negative endocrine ROS    Renal/GU negative Renal ROS  negative genitourinary   Musculoskeletal   Abdominal   Peds  Hematology negative hematology ROS (+)   Anesthesia Other Findings   Reproductive/Obstetrics negative OB ROS                             Anesthesia Physical Anesthesia Plan  ASA: 3  Anesthesia Plan: General   Post-op Pain Management: Minimal or no pain anticipated   Induction: Intravenous  PONV Risk Score and Plan: Propofol infusion  Airway Management Planned: Nasal Cannula and Natural Airway  Additional Equipment: None  Intra-op Plan:   Post-operative Plan:   Informed Consent: I have reviewed the patients History and Physical, chart, labs and discussed the procedure including the risks, benefits and alternatives for the proposed anesthesia with the patient or authorized representative who has indicated his/her understanding and acceptance.     Dental Advisory Given  Plan Discussed with: CRNA  Anesthesia Plan Comments:         Anesthesia Quick Evaluation

## 2023-05-15 NOTE — Anesthesia Postprocedure Evaluation (Signed)
Anesthesia Post Note  Patient: Wesley Daugherty  Procedure(s) Performed: ESOPHAGOGASTRODUODENOSCOPY (EGD) WITH PROPOFOL BIOPSY  Patient location during evaluation: PACU Anesthesia Type: General Level of consciousness: awake and alert Pain management: pain level controlled Vital Signs Assessment: post-procedure vital signs reviewed and stable Respiratory status: spontaneous breathing, nonlabored ventilation, respiratory function stable and patient connected to nasal cannula oxygen Cardiovascular status: stable and blood pressure returned to baseline Postop Assessment: no apparent nausea or vomiting Anesthetic complications: no   There were no known notable events for this encounter.   Last Vitals:  Vitals:   05/15/23 1030 05/15/23 1113  BP: (!) 112/98 108/62  Pulse:  65  Resp:  (!) 24  Temp:  36.4 C  SpO2:  100%    Last Pain:  Vitals:   05/15/23 1123  TempSrc:   PainSc: 0-No pain                 [name] L [name]

## 2023-05-16 LAB — SURGICAL PATHOLOGY

## 2023-05-21 ENCOUNTER — Encounter (HOSPITAL_COMMUNITY): Payer: Self-pay | Admitting: Internal Medicine

## 2023-07-10 ENCOUNTER — Ambulatory Visit: Payer: No Typology Code available for payment source | Admitting: Gastroenterology

## 2023-07-10 DIAGNOSIS — I10 Essential (primary) hypertension: Secondary | ICD-10-CM | POA: Diagnosis not present

## 2023-07-10 DIAGNOSIS — Z299 Encounter for prophylactic measures, unspecified: Secondary | ICD-10-CM | POA: Diagnosis not present

## 2023-07-10 DIAGNOSIS — R52 Pain, unspecified: Secondary | ICD-10-CM | POA: Diagnosis not present

## 2023-07-10 DIAGNOSIS — N1831 Chronic kidney disease, stage 3a: Secondary | ICD-10-CM | POA: Diagnosis not present

## 2023-07-10 DIAGNOSIS — J449 Chronic obstructive pulmonary disease, unspecified: Secondary | ICD-10-CM | POA: Diagnosis not present

## 2023-07-12 ENCOUNTER — Encounter: Payer: Self-pay | Admitting: Internal Medicine

## 2023-08-06 NOTE — Progress Notes (Deleted)
 GI Office Note    Referring Provider: Borum, Corean GRADE, MD Primary Care Physician:  Delana Corean GRADE, MD Primary Gastroenterologist: Carlin POUR. Cindie, DO  Date:  08/07/2023  ID:  Wesley Daugherty, DOB September 06, 1945, MRN [id number]   Chief Complaint   No chief complaint on file.  History of Present Illness  Wesley Daugherty is a 78 y.o. male with a history of asthma, dyslipidemia, GERD, and emphysema presenting today with complaint of   Colonoscopy 12/19/19: -diverticulosis  EGD 12/19/19: -hiatal hernia -gastritis  Initial office visit 04/05/23 with Dr. Cindie. Reported 10 year history of reflux. On omeprazole 20 mg once daily with relatively good control of symptoms. No dysphagia or odynophagia. Advised EGD for screening of barretts esophagus. Continue omeprazole.   EGD 05/15/23: - 4 cm hiatal hernia.  - Z- line irregular. Biopsied.  - Gastritis. Biopsied.  - Normal duodenum - Path: barrett's esophagus, negative H. Pylori, gastric mucosa with intestinal metaplasia - Follow up in office in 3 months.  - Repeat EGD in 5 years.   Today:    Wt Readings from Last 3 Encounters:  05/15/23 224 lb 10.4 oz (101.9 kg)  05/11/23 224 lb 10.4 oz (101.9 kg)  04/05/23 224 lb 9.6 oz (101.9 kg)    Current Outpatient Medications  Medication Sig Dispense Refill   amLODipine (NORVASC) 2.5 MG tablet Take 5 mg by mouth daily.     atorvastatin (LIPITOR) 10 MG tablet Take 10 mg by mouth daily.     buPROPion (WELLBUTRIN XL) 150 MG 24 hr tablet Take 150 mg by mouth daily.     Cholecalciferol (VITAMIN D3) 250 MCG (10000 UT) capsule Take 10,000 Units by mouth daily.     Ergocalciferol (VITAMIN D2) 50 MCG (2000 UT) TABS Take 1 tablet by mouth daily.     hydrochlorothiazide (HYDRODIURIL) 25 MG tablet Take 25 mg by mouth daily.     metoprolol tartrate (LOPRESSOR) 50 MG tablet      mirtazapine (REMERON) 15 MG tablet Take 15 mg by mouth at bedtime.     omeprazole (PRILOSEC) 20 MG capsule Take 20  mg by mouth daily.     pravastatin (PRAVACHOL) 80 MG tablet Take 80 mg by mouth daily.     No current facility-administered medications for this visit.    Past Medical History:  Diagnosis Date   Asthma    Dyslipidemia    Emphysema lung (HCC)    GERD (gastroesophageal reflux disease)     Past Surgical History:  Procedure Laterality Date   BIOPSY  05/15/2023   Procedure: BIOPSY;  Surgeon: Cindie Carlin POUR, DO;  [facility];  Service: Endoscopy;;   COLONOSCOPY     ESOPHAGOGASTRODUODENOSCOPY (EGD) WITH PROPOFOL  N/A 05/15/2023   Procedure: ESOPHAGOGASTRODUODENOSCOPY (EGD) WITH PROPOFOL ;  Surgeon: Cindie Carlin POUR, DO;  Location: [facility];  Service: Endoscopy;  Laterality: N/A;  12:00 pm, asa 3    Family History  Problem Relation Age of Onset   Colon cancer Neg Hx     Allergies as of 08/08/2023   (No Known Allergies)    Social History   Socioeconomic History   Marital status: Single    Spouse name: Not on file   Number of children: 1   Years of education: Not on file   Highest education level: High school graduate  Occupational History   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  Tobacco Use   Smoking status: Former  Current packs/day: 0.00    Average packs/day: 0.5 packs/day for 24.0 years (12.0 ttl pk-yrs)    Types: Cigarettes    Start date: 44    Quit date: 89    Years since quitting: 27.1   Smokeless tobacco: Never  Substance and Sexual Activity   Alcohol  use: Yes   Drug use: Never   Sexual activity: Not on file  Other Topics Concern   Not on file  Social History Narrative   Not on file   Social Drivers of Health   Financial Resource Strain: Not on file  Food Insecurity: Not on file  Transportation Needs: Not on file  Physical Activity: Not on file  Stress: Not on file  Social Connections: Not on file     Review of Systems   Gen: Denies fever, chills, anorexia. Denies fatigue, weakness,  weight loss.  CV: Denies chest pain, palpitations, syncope, peripheral edema, and claudication. Resp: Denies dyspnea at rest, cough, wheezing, coughing up blood, and pleurisy. GI: See HPI Derm: Denies rash, itching, dry skin Psych: Denies depression, anxiety, memory loss, confusion. No homicidal or suicidal ideation.  Heme: Denies bruising, bleeding, and enlarged lymph nodes.  Physical Exam   There were no vitals taken for this visit.  General:   Alert and oriented. No distress noted. Pleasant and cooperative.  Head:  Normocephalic and atraumatic. Eyes:  Conjuctiva clear without scleral icterus. Mouth:  Oral mucosa pink and moist. Good dentition. No lesions. Lungs:  Clear to auscultation bilaterally. No wheezes, rales, or rhonchi. No distress.  Heart:  S1, S2 present without murmurs appreciated.  Abdomen:  +BS, soft, non-tender and non-distended. No rebound or guarding. No HSM or masses noted. Rectal: *** Msk:  Symmetrical without gross deformities. Normal posture. Extremities:  Without edema. Neurologic:  Alert and  oriented x4 Psych:  Alert and cooperative. Normal mood and affect.  Assessment  Wesley Daugherty is a 78 y.o. male with a history of asthma, dyslipidemia, GERD, and emphysema presenting today with ***  Barrett's esophagus, GERD:  PLAN   *** Plan for EGD in 5 years, health permitting. Continue omeprazole once daily indefinitely. GERD diet    Wesley Melia, MSN, FNP-BC, AGACNP-BC [facility]

## 2023-08-08 ENCOUNTER — Ambulatory Visit: Payer: No Typology Code available for payment source | Admitting: Gastroenterology

## 2023-08-08 NOTE — Progress Notes (Signed)
 GI Office Note    Referring Provider: Borum, Corean GRADE, MD Primary Care Physician:  Delana Corean GRADE, MD Primary Gastroenterologist: Carlin POUR. Cindie, DO  Date:  08/09/2023  ID:  Wesley Daugherty, DOB 02/20/1946, MRN [id number]   Chief Complaint   Chief Complaint  Patient presents with   Follow-up    Follow up. No problems    History of Present Illness  Wesley Daugherty is a 78 y.o. male with a history of asthma, dyslipidemia, GERD, and emphysema presenting today for follow-up of reflux post EGD.   Colonoscopy 12/19/19: -diverticulosis   EGD 12/19/19: -hiatal hernia -gastritis   Initial office visit 04/05/23 with Dr. Cindie. Reported 10 year history of reflux. On omeprazole 20 mg once daily with relatively good control of symptoms. No dysphagia or odynophagia. Advised EGD for screening of barretts esophagus. Continue omeprazole.    EGD 05/15/23: - 4 cm hiatal hernia.  - Z- line irregular. Biopsied.  - Gastritis. Biopsied.  - Normal duodenum - Path: barrett's esophagus, negative H. Pylori, gastric mucosa with intestinal metaplasia - Follow up in office in 3 months.  - Repeat EGD in 5 years.   Today: Denies any smoking, does admit to drinking socially on holidays, otherwise no regular use.  Reflux well-controlled with omeprazole 20 mg once daily.  Denies any nausea, vomiting, dysphagia, odynophagia, lack of appetite, early satiety, weight loss, abdominal pain.  Also denies any melena, BRBPR, change in bowel habits, constipation, or diarrhea.  Has been using his albuterol a little bit more recently. Has not had his trelegy refilled.  Does have some shortness of breath with exertion.  Wt Readings from Last 3 Encounters:  08/09/23 222 lb 3.2 oz (100.8 kg)  05/15/23 224 lb 10.4 oz (101.9 kg)  05/11/23 224 lb 10.4 oz (101.9 kg)    Current Outpatient Medications  Medication Sig Dispense Refill   amLODipine (NORVASC) 2.5 MG tablet Take 5 mg by mouth daily.      atorvastatin (LIPITOR) 10 MG tablet Take 10 mg by mouth daily.     buPROPion (WELLBUTRIN XL) 150 MG 24 hr tablet Take 150 mg by mouth daily.     Cholecalciferol (VITAMIN D3) 250 MCG (10000 UT) capsule Take 10,000 Units by mouth daily.     Ergocalciferol (VITAMIN D2) 50 MCG (2000 UT) TABS Take 1 tablet by mouth daily.     hydrochlorothiazide (HYDRODIURIL) 25 MG tablet Take 25 mg by mouth daily.     metoprolol tartrate (LOPRESSOR) 50 MG tablet      mirtazapine (REMERON) 15 MG tablet Take 15 mg by mouth at bedtime.     omeprazole (PRILOSEC) 20 MG capsule Take 20 mg by mouth daily.     pravastatin (PRAVACHOL) 80 MG tablet Take 80 mg by mouth daily.     No current facility-administered medications for this visit.    Past Medical History:  Diagnosis Date   Asthma    Dyslipidemia    Emphysema lung (HCC)    GERD (gastroesophageal reflux disease)     Past Surgical History:  Procedure Laterality Date   BIOPSY  05/15/2023   Procedure: BIOPSY;  Surgeon: Cindie Carlin POUR, DO;  [facility];  Service: Endoscopy;;   COLONOSCOPY     ESOPHAGOGASTRODUODENOSCOPY (EGD) WITH PROPOFOL  N/A 05/15/2023   Procedure: ESOPHAGOGASTRODUODENOSCOPY (EGD) WITH PROPOFOL ;  Surgeon: Cindie Carlin POUR, DO;  [facility];  Service: Endoscopy;  Laterality: N/A;  12:00 pm, asa 3    Family History  Problem  Relation Age of Onset   Colon cancer Neg Hx     Allergies as of 08/09/2023   (No Known Allergies)    Social History   Socioeconomic History   Marital status: Single    Spouse name: Not on file   Number of children: 1   Years of education: Not on file   Highest education level: High school graduate  Occupational History   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  Tobacco Use   Smoking status: Former    Current packs/day: 0.00    Average packs/day: 0.5 packs/day for 24.0 years (12.0 ttl pk-yrs)    Types: Cigarettes    Start date: 58    Quit  date: 1998    Years since quitting: 27.1   Smokeless tobacco: Never  Substance and Sexual Activity   Alcohol  use: Yes   Drug use: Never   Sexual activity: Not on file  Other Topics Concern   Not on file  Social History Narrative   Not on file   Social Drivers of Health   Financial Resource Strain: Not on file  Food Insecurity: Not on file  Transportation Needs: Not on file  Physical Activity: Not on file  Stress: Not on file  Social Connections: Not on file   Review of Systems   Gen: Denies fever, chills, anorexia. Denies fatigue, weakness, weight loss.  CV: Denies chest pain, palpitations, syncope, peripheral edema, and claudication. Resp: + DOE, SOB. Denies cough, wheezing, coughing up blood, and pleurisy. GI: See HPI Derm: Denies rash, itching, dry skin Psych: Denies depression, anxiety, memory loss, confusion. No homicidal or suicidal ideation.  Heme: Denies bruising, bleeding, and enlarged lymph nodes.  Physical Exam   BP 133/77 (BP Location: Right Arm, Patient Position: Sitting, Cuff Size: Large)   Pulse 86   Temp 97.9 F (36.6 C) (Temporal)   Ht 5' 9 (1.753 m)   Wt 222 lb 3.2 oz (100.8 kg)   BMI 32.81 kg/m   General:   Alert and oriented. No distress noted. Pleasant and cooperative.  Head:  Normocephalic and atraumatic. Eyes:  Conjuctiva clear without scleral icterus. Mouth:  Oral mucosa pink and moist. Good dentition. No lesions. Lungs: Mild increased work of breathing with exertion Abdomen:  +BS, soft, non-tender and non-distended. No rebound or guarding. No HSM or masses noted. Rectal: deferred Neurologic:  Alert and  oriented x4 Psych:  Alert and cooperative. Normal mood and affect.  Assessment  Wesley Daugherty is a 78 y.o. male with a history of asthma, dyslipidemia, GERD, and emphysema presenting today for follow-up of reflux and Barrett's esophagus.   Barrett's esophagus, GERD, hiatal hernia: Symptoms relatively well-controlled with omeprazole 20  mg once daily.  He will continue this indefinitely.  Recent EGD with evidence of some gastritis as well as a 4 cm hiatal hernia.  Pathology was positive for Barrett's esophagus with intestinal metaplasia, without dysplasia, negative for H. pylori.  No alarm symptoms at this time.  We discussed GERD diet and the recommendation will be for long-term continuance of omeprazole, can increase to 40 mg if he starts having worsening symptoms.  We discussed his findings of his EGD and his pathology in detail today.  PLAN   Plan for EGD in 5 years, health permitting. Continue omeprazole 20 mg once daily indefinitely. GERD diet Follow up 1 year.     Charmaine Melia, MSN, FNP-BC, AGACNP-BC [facility]

## 2023-08-09 ENCOUNTER — Ambulatory Visit: Payer: No Typology Code available for payment source | Admitting: Gastroenterology

## 2023-08-09 ENCOUNTER — Encounter: Payer: Self-pay | Admitting: Gastroenterology

## 2023-08-09 VITALS — BP 133/77 | HR 86 | Temp 97.9°F | Ht 69.0 in | Wt 222.2 lb

## 2023-08-09 DIAGNOSIS — K2270 Barrett's esophagus without dysplasia: Secondary | ICD-10-CM | POA: Diagnosis not present

## 2023-08-09 DIAGNOSIS — K219 Gastro-esophageal reflux disease without esophagitis: Secondary | ICD-10-CM

## 2023-08-09 DIAGNOSIS — K449 Diaphragmatic hernia without obstruction or gangrene: Secondary | ICD-10-CM | POA: Diagnosis not present

## 2023-08-09 NOTE — Patient Instructions (Addendum)
 Continue taking your omeprazole 20 mg once daily.  If for any reason you need a refill and and are unable to get it from your primary care, please feel free to reach out to us .  Follow a GERD diet:  Avoid fried, fatty, greasy, spicy, citrus foods. Avoid caffeine and carbonated beverages. Avoid chocolate. Try eating 4-6 small meals a day rather than 3 large meals. Do not eat within 3 hours of laying down. Prop head of bed up on wood or bricks to create a 6 inch incline.  We will plan to follow-up in 1 year, sooner if needed.  It was a pleasure to see you today. I want to create trusting relationships with patients. If you receive a survey regarding your visit,  I greatly appreciate you taking time to fill this out on paper or through your MyChart. I value your feedback.  Charmaine Melia, MSN, FNP-BC, AGACNP-BC [facility]

## 2023-11-19 DIAGNOSIS — Z1331 Encounter for screening for depression: Secondary | ICD-10-CM | POA: Diagnosis not present

## 2023-11-19 DIAGNOSIS — F419 Anxiety disorder, unspecified: Secondary | ICD-10-CM | POA: Diagnosis not present

## 2023-11-19 DIAGNOSIS — J449 Chronic obstructive pulmonary disease, unspecified: Secondary | ICD-10-CM | POA: Diagnosis not present

## 2023-11-19 DIAGNOSIS — I10 Essential (primary) hypertension: Secondary | ICD-10-CM | POA: Diagnosis not present

## 2023-11-19 DIAGNOSIS — Z1339 Encounter for screening examination for other mental health and behavioral disorders: Secondary | ICD-10-CM | POA: Diagnosis not present

## 2023-11-19 DIAGNOSIS — Z7189 Other specified counseling: Secondary | ICD-10-CM | POA: Diagnosis not present

## 2023-11-19 DIAGNOSIS — Z0000 Encounter for general adult medical examination without abnormal findings: Secondary | ICD-10-CM | POA: Diagnosis not present

## 2023-11-19 DIAGNOSIS — R52 Pain, unspecified: Secondary | ICD-10-CM | POA: Diagnosis not present

## 2023-11-19 DIAGNOSIS — Z299 Encounter for prophylactic measures, unspecified: Secondary | ICD-10-CM | POA: Diagnosis not present

## 2023-12-11 DIAGNOSIS — R600 Localized edema: Secondary | ICD-10-CM | POA: Diagnosis not present

## 2024-02-08 DIAGNOSIS — Z299 Encounter for prophylactic measures, unspecified: Secondary | ICD-10-CM | POA: Diagnosis not present

## 2024-02-08 DIAGNOSIS — R972 Elevated prostate specific antigen [PSA]: Secondary | ICD-10-CM | POA: Diagnosis not present

## 2024-02-08 DIAGNOSIS — R5383 Other fatigue: Secondary | ICD-10-CM | POA: Diagnosis not present

## 2024-02-08 DIAGNOSIS — I10 Essential (primary) hypertension: Secondary | ICD-10-CM | POA: Diagnosis not present

## 2024-02-08 DIAGNOSIS — Z0000 Encounter for general adult medical examination without abnormal findings: Secondary | ICD-10-CM | POA: Diagnosis not present

## 2024-02-08 DIAGNOSIS — E7800 Pure hypercholesterolemia, unspecified: Secondary | ICD-10-CM | POA: Diagnosis not present

## 2024-02-20 DIAGNOSIS — Z125 Encounter for screening for malignant neoplasm of prostate: Secondary | ICD-10-CM | POA: Diagnosis not present

## 2024-02-20 DIAGNOSIS — R5383 Other fatigue: Secondary | ICD-10-CM | POA: Diagnosis not present

## 2024-02-20 DIAGNOSIS — E7800 Pure hypercholesterolemia, unspecified: Secondary | ICD-10-CM | POA: Diagnosis not present

## 2024-02-20 DIAGNOSIS — Z79899 Other long term (current) drug therapy: Secondary | ICD-10-CM | POA: Diagnosis not present

## 2024-02-28 DIAGNOSIS — N1831 Chronic kidney disease, stage 3a: Secondary | ICD-10-CM | POA: Diagnosis not present

## 2024-02-28 DIAGNOSIS — I471 Supraventricular tachycardia, unspecified: Secondary | ICD-10-CM | POA: Diagnosis not present

## 2024-02-28 DIAGNOSIS — Z299 Encounter for prophylactic measures, unspecified: Secondary | ICD-10-CM | POA: Diagnosis not present

## 2024-02-28 DIAGNOSIS — I10 Essential (primary) hypertension: Secondary | ICD-10-CM | POA: Diagnosis not present

## 2024-02-28 DIAGNOSIS — R972 Elevated prostate specific antigen [PSA]: Secondary | ICD-10-CM | POA: Diagnosis not present

## 2024-03-14 DIAGNOSIS — J449 Chronic obstructive pulmonary disease, unspecified: Secondary | ICD-10-CM | POA: Diagnosis not present

## 2024-03-14 DIAGNOSIS — R059 Cough, unspecified: Secondary | ICD-10-CM | POA: Diagnosis not present

## 2024-03-14 DIAGNOSIS — Z299 Encounter for prophylactic measures, unspecified: Secondary | ICD-10-CM | POA: Diagnosis not present

## 2024-03-14 DIAGNOSIS — I10 Essential (primary) hypertension: Secondary | ICD-10-CM | POA: Diagnosis not present

## 2024-03-14 DIAGNOSIS — R0602 Shortness of breath: Secondary | ICD-10-CM | POA: Diagnosis not present

## 2024-03-28 DIAGNOSIS — I471 Supraventricular tachycardia, unspecified: Secondary | ICD-10-CM | POA: Diagnosis not present

## 2024-03-28 DIAGNOSIS — N1831 Chronic kidney disease, stage 3a: Secondary | ICD-10-CM | POA: Diagnosis not present

## 2024-03-28 DIAGNOSIS — N400 Enlarged prostate without lower urinary tract symptoms: Secondary | ICD-10-CM | POA: Diagnosis not present

## 2024-03-28 DIAGNOSIS — R739 Hyperglycemia, unspecified: Secondary | ICD-10-CM | POA: Diagnosis not present

## 2024-03-28 DIAGNOSIS — Z299 Encounter for prophylactic measures, unspecified: Secondary | ICD-10-CM | POA: Diagnosis not present

## 2024-03-28 DIAGNOSIS — I10 Essential (primary) hypertension: Secondary | ICD-10-CM | POA: Diagnosis not present

## 2024-04-10 DIAGNOSIS — R918 Other nonspecific abnormal finding of lung field: Secondary | ICD-10-CM | POA: Diagnosis not present

## 2024-04-16 ENCOUNTER — Other Ambulatory Visit (HOSPITAL_COMMUNITY): Payer: Self-pay | Admitting: Medical Oncology

## 2024-04-16 DIAGNOSIS — R918 Other nonspecific abnormal finding of lung field: Secondary | ICD-10-CM

## 2024-04-17 ENCOUNTER — Ambulatory Visit (HOSPITAL_COMMUNITY): Admission: RE | Admit: 2024-04-17 | Source: Ambulatory Visit

## 2024-04-24 ENCOUNTER — Encounter (HOSPITAL_COMMUNITY): Payer: Self-pay

## 2024-04-24 ENCOUNTER — Encounter (HOSPITAL_COMMUNITY)
Admission: RE | Admit: 2024-04-24 | Discharge: 2024-04-24 | Disposition: A | Discharge status: Home / Self Care | Payer: Worker's Compensation | Source: Ambulatory Visit | Attending: Medical Oncology | Admitting: Medical Oncology

## 2024-04-24 DIAGNOSIS — R918 Other nonspecific abnormal finding of lung field: Secondary | ICD-10-CM | POA: Insufficient documentation

## 2024-05-01 ENCOUNTER — Ambulatory Visit (HOSPITAL_COMMUNITY)
Admission: RE | Admit: 2024-05-01 | Discharge: 2024-05-01 | Disposition: A | Discharge status: Home / Self Care | Payer: Worker's Compensation | Source: Ambulatory Visit | Attending: Medical Oncology | Admitting: Medical Oncology

## 2024-05-01 DIAGNOSIS — K3189 Other diseases of stomach and duodenum: Secondary | ICD-10-CM | POA: Insufficient documentation

## 2024-05-01 DIAGNOSIS — I2510 Atherosclerotic heart disease of native coronary artery without angina pectoris: Secondary | ICD-10-CM | POA: Diagnosis not present

## 2024-05-01 DIAGNOSIS — N400 Enlarged prostate without lower urinary tract symptoms: Secondary | ICD-10-CM | POA: Diagnosis not present

## 2024-05-01 DIAGNOSIS — I700 Atherosclerosis of aorta: Secondary | ICD-10-CM | POA: Diagnosis not present

## 2024-05-01 DIAGNOSIS — R918 Other nonspecific abnormal finding of lung field: Secondary | ICD-10-CM | POA: Insufficient documentation

## 2024-05-01 MED ORDER — FLUDEOXYGLUCOSE F - 18 (FDG) INJECTION
12.0200 | Freq: Once | INTRAVENOUS | Status: AC | PRN
  Administered 2024-05-01: 12.02 via INTRAVENOUS

## 2024-05-07 DIAGNOSIS — R918 Other nonspecific abnormal finding of lung field: Secondary | ICD-10-CM | POA: Diagnosis not present

## 2024-05-08 ENCOUNTER — Other Ambulatory Visit (HOSPITAL_COMMUNITY)

## 2024-05-22 DIAGNOSIS — J449 Chronic obstructive pulmonary disease, unspecified: Secondary | ICD-10-CM | POA: Diagnosis not present

## 2024-05-22 DIAGNOSIS — Z299 Encounter for prophylactic measures, unspecified: Secondary | ICD-10-CM | POA: Diagnosis not present

## 2024-05-22 DIAGNOSIS — N1831 Chronic kidney disease, stage 3a: Secondary | ICD-10-CM | POA: Diagnosis not present

## 2024-05-22 DIAGNOSIS — I10 Essential (primary) hypertension: Secondary | ICD-10-CM | POA: Diagnosis not present

## 2024-05-22 DIAGNOSIS — R972 Elevated prostate specific antigen [PSA]: Secondary | ICD-10-CM | POA: Diagnosis not present

## 2024-06-02 NOTE — Progress Notes (Unsigned)
 CC: Elevated PSA  HPI: 78 year old male sent by Dr. Loreli for evaluation and management of elevated PSA.  This was checked in August of this year and was 8.0.  I do not have further data, but the patient says that in the past his PSA has been in the 4-5 range.  There is no family history of prostate cancer.  He does not have significant lower urinary tract symptoms.  IPSS 10/3.   PMH: Past Medical History:  Diagnosis Date   Asthma    Dyslipidemia    Emphysema lung (HCC)    GERD (gastroesophageal reflux disease)     Surgical History: Past Surgical History:  Procedure Laterality Date   BIOPSY  05/15/2023   Procedure: BIOPSY;  Surgeon: Cindie Carlin POUR, DO;  Location: [facility];  Service: Endoscopy;;   COLONOSCOPY     ESOPHAGOGASTRODUODENOSCOPY (EGD) WITH PROPOFOL  N/A 05/15/2023   Procedure: ESOPHAGOGASTRODUODENOSCOPY (EGD) WITH PROPOFOL ;  Surgeon: Cindie Carlin POUR, DO;  Location: [facility];  Service: Endoscopy;  Laterality: N/A;  12:00 pm, asa 3    Home Medications:  Allergies as of 06/03/2024   No Known Allergies      Medication List        Accurate as of June 02, 2024 12:47 PM. If you have any questions, ask your nurse or doctor.          albuterol 108 (90 Base) MCG/ACT inhaler Commonly known as: VENTOLIN HFA SMARTSIG:2 Puff(s) By Mouth Every 4-6 Hours PRN   amLODipine 2.5 MG tablet Commonly known as: NORVASC Take 5 mg by mouth daily.   atorvastatin 10 MG tablet Commonly known as: LIPITOR Take 10 mg by mouth daily.   buPROPion 150 MG 24 hr tablet Commonly known as: WELLBUTRIN XL Take 150 mg by mouth daily.   hydrochlorothiazide 25 MG tablet Commonly known as: HYDRODIURIL Take 25 mg by mouth daily.   metoprolol tartrate 50 MG tablet Commonly known as: LOPRESSOR   mirtazapine 15 MG tablet Commonly known as: REMERON Take 15 mg by mouth at bedtime.   omeprazole 20 MG capsule Commonly known as: PRILOSEC Take 20 mg by mouth daily.    pravastatin 80 MG tablet Commonly known as: PRAVACHOL Take 80 mg by mouth daily.   Trelegy Ellipta 100-62.5-25 MCG/ACT Aepb Generic drug: Fluticasone-Umeclidin-Vilant Inhale 1 puff into the lungs daily.   Vitamin D2 50 MCG (2000 UT) Tabs Take 1 tablet by mouth daily.   Vitamin D3 250 MCG (10000 UT) capsule Take 10,000 Units by mouth daily.        Allergies: No Known Allergies  Family History: Family History  Problem Relation Age of Onset   Colon cancer Neg Hx    Colon polyps Neg Hx     Social History:  reports that he quit smoking about 27 years ago. His smoking use included cigarettes. He started smoking about 51 years ago. He has a 12 pack-year smoking history. He has never used smokeless tobacco. He reports current alcohol  use. He reports that he does not use drugs.  ROS: All other review of systems were reviewed and are negative except what is noted above in HPI  Physical Exam: There were no vitals taken for this visit.  Constitutional:  Alert and oriented, No acute distress. HEENT: [place] AT, moist mucus membranes.  Trachea midline, no masses. Cardiovascular: No clubbing, cyanosis, or edema. Respiratory: Normal respiratory effort, no increased work of breathing. GI: No inguinal hernias GU: Normal phallus. No masses/lesions on penis, testis,  scrotum. Prostate 60 g smooth no nodules no induration.  Lymph: No cervical or inguinal lymphadenopathy. Skin: No rashes, bruises or suspicious lesions. Neurologic: Grossly intact, no focal deficits, moving all 4 extremities. Psychiatric: Normal mood and affect.  Laboratory Data: Lab Results  Component Value Date   WBC 9.3 08/14/2017   HGB 13.8 08/14/2017   HCT 42.0 08/14/2017   MCV 83.9 08/14/2017   PLT 331.0 08/14/2017    Lab Results  Component Value Date   CREATININE 1.26 (H) 05/11/2023     Urinalysis  PSA 8.0 on 8.21.2025  Urinalysis clear  IPSS sheet reviewed Signed PCP notes reviewed  Assessment:   Elevated PSA.  I only have 1 data point, 8.0 about 3 months ago.  Benign exam.  He does feel like his prostate is enlarged  Plan:   I will have his PSA rechecked today  If PSA continued high, proceed with ultrasound and biopsy.  If back down to his more baseline level, continue surveillance There are no diagnoses linked to this encounter.  No follow-ups on file.  Garnette CHRISTELLA Shack, MD  [facility] [place]

## 2024-06-03 ENCOUNTER — Ambulatory Visit: Admitting: Urology

## 2024-06-03 VITALS — BP 138/81 | HR 80

## 2024-06-03 DIAGNOSIS — R972 Elevated prostate specific antigen [PSA]: Secondary | ICD-10-CM | POA: Diagnosis not present

## 2024-06-04 LAB — MICROSCOPIC EXAMINATION
Bacteria, UA: NONE SEEN
WBC, UA: NONE SEEN /HPF (ref 0–5)

## 2024-06-04 LAB — URINALYSIS, ROUTINE W REFLEX MICROSCOPIC
Bilirubin, UA: NEGATIVE
Glucose, UA: NEGATIVE
Ketones, UA: NEGATIVE
Leukocytes,UA: NEGATIVE
Nitrite, UA: NEGATIVE
Protein,UA: NEGATIVE
Specific Gravity, UA: 1.025 (ref 1.005–1.030)
Urobilinogen, Ur: 1 mg/dL (ref 0.2–1.0)
pH, UA: 5.5 (ref 5.0–7.5)

## 2024-06-04 LAB — PSA: Prostate Specific Ag, Serum: 9.3 ng/mL — ABNORMAL HIGH (ref 0.0–4.0)
